# Patient Record
Sex: Female | Born: 1975 | Race: White | Hispanic: No | Marital: Single | Smoking: Never smoker
Health system: Southern US, Community
[De-identification: ages and names within clinical notes are randomized; demographics above are authoritative.]

## PROBLEM LIST (undated history)

## (undated) HISTORY — PX: OTHER SURGICAL HISTORY: SHX169

---

## 2020-02-23 ENCOUNTER — Ambulatory Visit: Payer: Self-pay | Attending: Family

## 2020-02-23 DIAGNOSIS — Z23 Encounter for immunization: Secondary | ICD-10-CM

## 2020-02-23 NOTE — Progress Notes (Signed)
   Covid-19 Vaccination Clinic  Name:  Kristine James    MRN: [id number] DOB: March 25, 1976  02/23/2020  [name] was observed post Covid-19 immunization for 15 minutes without incident. She was provided with Vaccine Information Sheet and instruction to access the V-Safe system.   [name] was instructed to call 911 with any severe reactions post vaccine: Marland Kitchen Difficulty breathing  . Swelling of face and throat  . A fast heartbeat  . A bad rash all over body  . Dizziness and weakness   Immunizations Administered    Name Date Dose VIS Date Route   Pfizer COVID-19 Vaccine 02/23/2020 12:59 PM 0.3 mL 09/30/2018 Intramuscular   Manufacturer: [employer], [employer]   Lot: [id number]   NDC: [address]-9

## 2020-03-15 ENCOUNTER — Ambulatory Visit: Payer: Self-pay | Attending: Internal Medicine

## 2020-03-15 DIAGNOSIS — Z23 Encounter for immunization: Secondary | ICD-10-CM

## 2020-03-15 NOTE — Progress Notes (Signed)
° °  Covid-19 Vaccination Clinic  Name:  Annya Lizana    MRN: [id number] DOB: 11/05/1975  03/15/2020  Ms. Blevins was observed post Covid-19 immunization for 15 minutes without incident. She was provided with Vaccine Information Sheet and instruction to access the V-Safe system.   Ms. Giuliano was instructed to call 911 with any severe reactions post vaccine:  Difficulty breathing   Swelling of face and throat   A fast heartbeat   A bad rash all over body   Dizziness and weakness   Immunizations Administered    Name Date Dose VIS Date Route   Pfizer COVID-19 Vaccine 03/15/2020 10:16 AM 0.3 mL 09/30/2018 Intramuscular   Manufacturer: [employer], [employer]   Lot: [id number]   NDC: [address]-3

## 2020-08-09 ENCOUNTER — Other Ambulatory Visit: Payer: Self-pay | Admitting: Obstetrics

## 2020-08-09 ENCOUNTER — Ambulatory Visit
Admission: RE | Admit: 2020-08-09 | Discharge: 2020-08-09 | Disposition: A | Discharge status: Home / Self Care | Payer: BC Managed Care – PPO | Source: Ambulatory Visit | Attending: Obstetrics | Admitting: Obstetrics

## 2020-08-09 DIAGNOSIS — E282 Polycystic ovarian syndrome: Secondary | ICD-10-CM

## 2020-08-09 DIAGNOSIS — R7989 Other specified abnormal findings of blood chemistry: Secondary | ICD-10-CM

## 2020-08-09 DIAGNOSIS — N6310 Unspecified lump in the right breast, unspecified quadrant: Secondary | ICD-10-CM

## 2020-09-02 ENCOUNTER — Ambulatory Visit
Admission: RE | Admit: 2020-09-02 | Discharge: 2020-09-02 | Disposition: A | Discharge status: Home / Self Care | Payer: BC Managed Care – PPO | Source: Ambulatory Visit | Attending: Obstetrics | Admitting: Obstetrics

## 2020-09-02 ENCOUNTER — Other Ambulatory Visit: Payer: Self-pay

## 2020-09-02 DIAGNOSIS — N6310 Unspecified lump in the right breast, unspecified quadrant: Secondary | ICD-10-CM

## 2021-02-09 ENCOUNTER — Ambulatory Visit: Payer: BC Managed Care – PPO | Attending: Family

## 2021-02-09 DIAGNOSIS — Z23 Encounter for immunization: Secondary | ICD-10-CM

## 2021-02-09 NOTE — Progress Notes (Signed)
   Covid-19 Vaccination Clinic  Name:  Kristine James    MRN: [id number] DOB: July 16, 1976  02/09/2021  [name] was observed post Covid-19 immunization for 15 minutes without incident. She was provided with Vaccine Information Sheet and instruction to access the V-Safe system.   [name] was instructed to call 911 with any severe reactions post vaccine: Difficulty breathing  Swelling of face and throat  A fast heartbeat  A bad rash all over body  Dizziness and weakness   Immunizations Administered     Name Date Dose VIS Date Route   Pfizer COVID-19 Vaccine 02/09/2021  4:21 PM 0.3 mL 05/25/2020 Intramuscular   Manufacturer: [employer], [employer]   Lot: [id number]   NDC: [address]-7

## 2021-03-06 ENCOUNTER — Ambulatory Visit: Payer: BC Managed Care – PPO | Admitting: Family Medicine

## 2021-03-06 ENCOUNTER — Ambulatory Visit
Admission: RE | Admit: 2021-03-06 | Discharge: 2021-03-06 | Disposition: A | Discharge status: Home / Self Care | Payer: BC Managed Care – PPO | Source: Ambulatory Visit | Attending: Family Medicine | Admitting: Family Medicine

## 2021-03-06 ENCOUNTER — Other Ambulatory Visit: Payer: Self-pay

## 2021-03-06 ENCOUNTER — Encounter: Payer: Self-pay | Admitting: Family Medicine

## 2021-03-06 ENCOUNTER — Ambulatory Visit: Payer: Self-pay

## 2021-03-06 VITALS — BP 104/62 | Ht 61.0 in | Wt 122.0 lb

## 2021-03-06 DIAGNOSIS — M25511 Pain in right shoulder: Secondary | ICD-10-CM

## 2021-03-06 DIAGNOSIS — G8929 Other chronic pain: Secondary | ICD-10-CM

## 2021-03-06 NOTE — Progress Notes (Signed)
PCP: Kathreen Cosier, MD  Subjective:   HPI: Patient is a 45 y.o. female here for right shoulder pain.  Patient reports in January she dove forward away from a bus and landed on ground, sustaining injury to right shoulder. Immediate pain anterior right shoulder with some lateral pain. Has tried home exercises, pilates, massage, 9 visits of physical therapy up until about 3 weeks ago. Motion continues to be limited, difficulty sleeping and with overhead motions. Recalls years ago she had a problem with right shoulder and had 'weakness' here.  History reviewed. No pertinent past medical history.  No current outpatient medications on file prior to visit.   No current facility-administered medications on file prior to visit.    History reviewed. No pertinent surgical history.  No Known Allergies  Social History   Socioeconomic History   Marital status: Single    Spouse name: Not on file   Number of children: Not on file   Years of education: Not on file   Highest education level: Not on file  Occupational History   Not on file  Tobacco Use   Smoking status: Not on file   Smokeless tobacco: Not on file  Substance and Sexual Activity   Alcohol use: Not on file   Drug use: Not on file   Sexual activity: Not on file  Other Topics Concern   Not on file  Social History Narrative   Not on file   Social Determinants of Health   Financial Resource Strain: Not on file  Food Insecurity: Not on file  Transportation Needs: Not on file  Physical Activity: Not on file  Stress: Not on file  Social Connections: Not on file  Intimate Partner Violence: Not on file    History reviewed. No pertinent family history.  BP 104/62   Ht 5\' 1"  (1.549 m)   Wt 122 lb (55.3 kg)   BMI 23.05 kg/m   No flowsheet data found.  No flowsheet data found.  Review of Systems: See HPI above.     Objective:  Physical Exam:  Gen: NAD, comfortable in exam room  Right shoulder: No  swelling, ecchymoses.  No gross deformity. No TTP AC joint, biceps tendon. FROM with painful arc. Positive Hawkins, Neers. Negative Yergasons. Strength 4/5 with empty can and 5/5 resisted internal/external rotation.  Pain empty can and ER. Negative apprehension. NV intact distally.  Complete MSK u/s right shoulder: Biceps tendon: intact on long and short views without tenosynovitis Pec major tendon: intact Subscapularis: intact without abnormalities AC joint: mild arthropathy without effusion Infraspinatus: intact Supraspinatus: focus of hyperechogenicity consistent with calcification interstitially; near biceps tendon there is cortical irregularity and disruption of supraspinatus consistent with at least high grade partial tear Posterior glenohumeral joint: normal without effusion, paralabral cyst, labral abnormalities.  Impression: at least partial thickness supraspinatus tear, calcific tendinopathy of supraspinatus.   Assessment & Plan:  1. Right shoulder pain - concern for at least high grade partial tear of supraspinatus near biceps tendon.  Will go ahead with MRI to further assess.  X-rays ordered as precursor and will call patient with results.  Has done extensive physical therapy (9 visits), home exercises, OTC medications without relief.

## 2021-03-06 NOTE — Patient Instructions (Signed)
Your ultrasound shows a likely high grade partial tear of one of your rotator cuff muscles. We will go ahead with an MRI to assess this in more detail. Get x-rays after you leave today (required by insurance). Ok to continue with your home exercises, massage, medications as we discussed in the meantime. Either follow up with me after the MRI for a no charge visit or I can call you with the results and the next steps, whichever you prefer.

## 2021-03-07 ENCOUNTER — Ambulatory Visit
Admission: RE | Admit: 2021-03-07 | Discharge: 2021-03-07 | Disposition: A | Discharge status: Home / Self Care | Payer: BC Managed Care – PPO | Source: Ambulatory Visit | Attending: Family Medicine | Admitting: Family Medicine

## 2021-03-07 DIAGNOSIS — G8929 Other chronic pain: Secondary | ICD-10-CM

## 2021-03-09 ENCOUNTER — Ambulatory Visit (INDEPENDENT_AMBULATORY_CARE_PROVIDER_SITE_OTHER): Payer: BC Managed Care – PPO | Admitting: Family Medicine

## 2021-03-09 ENCOUNTER — Other Ambulatory Visit: Payer: Self-pay

## 2021-03-09 ENCOUNTER — Encounter: Payer: Self-pay | Admitting: Family Medicine

## 2021-03-09 VITALS — BP 120/76 | Wt 122.0 lb

## 2021-03-09 DIAGNOSIS — G8929 Other chronic pain: Secondary | ICD-10-CM

## 2021-03-09 DIAGNOSIS — M25511 Pain in right shoulder: Secondary | ICD-10-CM

## 2021-03-09 NOTE — Progress Notes (Signed)
MRI reviewed and discussed with patient. Unfortunately MRI confirmed full thickness supraspinatus tear but no evidence muscle atrophy and with < 1 cm retraction.  Has been symptomatic for 7 months and not improving with conservative measures.  Will refer to ortho to discuss repair.

## 2021-03-09 NOTE — Patient Instructions (Signed)
We are referring you to an orthopedic surgeon for your right shoulder rotator cuff tear.  Dr. [facility] [address] [place], [place] [phone]  Appt: 03/14/21 @ 8:15 am Please arrive at 8 am.  If you need to reschedule/change this appt please call the number above.

## 2021-06-28 ENCOUNTER — Ambulatory Visit (INDEPENDENT_AMBULATORY_CARE_PROVIDER_SITE_OTHER): Payer: BC Managed Care – PPO | Admitting: Sports Medicine

## 2021-06-28 VITALS — BP 110/82 | Ht 61.0 in | Wt 122.0 lb

## 2021-06-28 DIAGNOSIS — M25511 Pain in right shoulder: Secondary | ICD-10-CM | POA: Diagnosis not present

## 2021-06-28 DIAGNOSIS — G8929 Other chronic pain: Secondary | ICD-10-CM | POA: Diagnosis not present

## 2021-06-28 DIAGNOSIS — M25512 Pain in left shoulder: Secondary | ICD-10-CM | POA: Diagnosis not present

## 2021-06-28 MED ORDER — IBUPROFEN 600 MG PO TABS: 600.0000 mg | ORAL_TABLET | Freq: Four times a day (QID) | ORAL | 0 refills | Status: AC | PRN

## 2021-06-28 NOTE — Progress Notes (Signed)
PCP: Kathreen Cosier, MD  Subjective:   HPI: Patient is a 45 y.o. female here for evaluation of left shoulder pain, chronic right shoulder pain.  Chronic right shoulder pain/stiffness -patient had a history of a full-thickness right supraspinatus tear back earlier in 2022, she underwent rotator cuff repair by Dr. Everardo Pacific on March 20, 2021.  She has been undergoing physical therapy, recently twice a week at [facility] and feels like this has helped with motion and strength, although it is not 100%.  She previously also saw Dr. Ave Filter for a second opinion months ago, and he reiterated the same thing with importance of PT and prolonged recovery.  Patient wants to be sure that she is undergoing the appropriate treatment course to get this better today.  Left shoulder -she states that about 3 weeks ago she was pushing herself to get out of the pool when she felt a pain in the anterior left shoulder.  She denies feeling any pop, no swelling, erythema or ecchymosis.  She did take ibuprofen for about the first week with no significant improvement.  She will have pain when reaching outward at times. She denies prior injury of this left shoulder.  No nighttime pain.   No past medical history on file.  No current outpatient medications on file prior to visit.   No current facility-administered medications on file prior to visit.    No past surgical history on file.  No Known Allergies  BP 110/82   Ht 5\' 1"  (1.549 m)   Wt 122 lb (55.3 kg)   BMI 23.05 kg/m   [facility] 06/28/2021  Frequency of aerobic exercise (# of days/week) 1  Average time in minutes 60  Frequency of strengthening activities (# of days/week) 7    No flowsheet data found.      Objective:  Physical Exam:  Gen: Well-appearing, in no acute distress; non-toxic CV: Regular Rate. Well-perfused. Warm.  Resp: Breathing unlabored on room air; no wheezing. Psych: Fluid speech in  conversation; appropriate affect; normal thought process Neuro: Sensation intact throughout. No gross coordination deficits.  MSK:   - Right shoulder: Prior portal incision sites well-healed.  No erythema, ecchymosis or swelling.  There is limited external rotation and abduction of the shoulder.  Internal rotation and flexion equivocal to contralateral arm.  5/5 strength.  Neurovascular intact distally.  - Left shoulder: There is mild TTP noted around the bicipital groove just medial to this.  There is no overlying erythema ecchymosis or swelling.  No evidence of bony deformity.  Range of motion is full in all directions.  5/5 strength throughout.  + Mildly positive speeds test, negative Yergason's. + Equivocal Hornblower's test.  Negative empty can, Jobes, internal/external rotation.  Negative Hawkins impingement.   Assessment & Plan:  1. Chronic right shoulder pain s/p supraspinatus repair Aug 2021 by Dr. Sep 2021.  Patient with no worrisome findings on exam today.  She does have some restriction with external rotation and AB duction, although we did discuss she likely had a rotator cuff tear for many months, and it takes up to 6-12 months for full recovery and improvement with PT to regain range of motion and full strength.  2. Left shoulder pain, subacute -etiology unclear, exam more so indicative of biceps versus pectoralis minor pathology.  We did provide the patient with rotator cuff and tendon home exercises to do.  She may ice or heat over painful area and take over-the-counter anti-inflammatories as needed.  She will return in about 2 weeks if her shoulder pain is still not improving and at that time we will do complete ultrasound of the left shoulder.  She is agreeable to this plan.  Madelyn Brunner, DO PGY-4, Sports Medicine Fellow [facility]  Addendum:  I was the preceptor for this visit and available for immediate consultation.  Norton Blizzard MD Marrianne Mood

## 2021-06-28 NOTE — Patient Instructions (Signed)
It was great to meet you today, thank you for letting me participate in your care!  Today, we discussed your shoulders. Your right shoulder continues to come along.  Be sure you are continuing your physical therapy and exercises to increase range of motion.  For your left shoulder, We will give you a set of home exercises to perform once daily. You may ice or heat the shoulder where it is painful. You may take over-the-counter Aleve/ibuprofen or Tylenol for any severe pain.  You will follow-up in about 2.5-3 weeks if your shoulder is not at least 80% better.  If you have any further questions, please give the clinic a call [phone].  Cheers,  Madelyn Brunner, DO [facility]

## 2021-07-12 ENCOUNTER — Encounter: Payer: Self-pay | Admitting: Family Medicine

## 2021-07-12 ENCOUNTER — Ambulatory Visit (INDEPENDENT_AMBULATORY_CARE_PROVIDER_SITE_OTHER): Payer: BC Managed Care – PPO | Admitting: Family Medicine

## 2021-07-12 ENCOUNTER — Ambulatory Visit: Payer: Self-pay

## 2021-07-12 VITALS — BP 102/70 | Ht 61.0 in | Wt 122.0 lb

## 2021-07-12 DIAGNOSIS — M25512 Pain in left shoulder: Secondary | ICD-10-CM | POA: Diagnosis not present

## 2021-07-12 NOTE — Patient Instructions (Signed)
You strained your biceps tendon and have a very small interstitial tear of your supraspinatus (one of the rotator cuff muscles). This isn't like what happened to the right shoulder and should do very well with conservative treatment. Continue with physical therapy - we will let [facility] know what these diagnoses are Aleve 2 tabs twice a day with food for pain and inflammation as needed. Icing 15 minutes at a time as needed. Do home exercises on days you don't go to therapy. For swimming and working out at [employer], do activities that don't make the pain worse than a 3 on a scale of 1-10. Follow up with [place] in 5-6 weeks for reevaluation.

## 2021-07-12 NOTE — Progress Notes (Signed)
PCP: Kathreen Cosier, MD  Subjective:   HPI: Patient is a 45 y.o. female here for left shoulder pain.  11/23: Chronic right shoulder pain/stiffness -patient had a history of a full-thickness right supraspinatus tear back earlier in 2022, she underwent rotator cuff repair by Dr. Everardo Pacific on March 20, 2021.  She has been undergoing physical therapy, recently twice a week at [facility] and feels like this has helped with motion and strength, although it is not 100%.  She previously also saw Dr. Ave Filter for a second opinion months ago, and he reiterated the same thing with importance of PT and prolonged recovery.  Patient wants to be sure that she is undergoing the appropriate treatment course to get this better today.  Left shoulder -she states that about 3 weeks ago she was pushing herself to get out of the pool when she felt a pain in the anterior left shoulder.  She denies feeling any pop, no swelling, erythema or ecchymosis.  She did take ibuprofen for about the first week with no significant improvement.  She will have pain when reaching outward at times. She denies prior injury of this left shoulder.  No nighttime pain.  12/7: Patient returns with continued left shoulder pain for ultrasound. She has been doing physical therapy, home exercises without much improvement in anterior left shoulder pain.  History reviewed. No pertinent past medical history.  Current Outpatient Medications on File Prior to Visit  Medication Sig Dispense Refill   ibuprofen (ADVIL) 600 MG tablet Take 1 tablet (600 mg total) by mouth every 6 (six) hours as needed. 30 tablet 0   No current facility-administered medications on file prior to visit.    History reviewed. No pertinent surgical history.  No Known Allergies  BP 102/70   Ht 5\' 1"  (1.549 m)   Wt 122 lb (55.3 kg)   BMI 23.05 kg/m   [facility] Adult Exercise 06/28/2021 07/12/2021  Frequency of aerobic exercise (# of days/week) 1 2   Average time in minutes 60 10  Frequency of strengthening activities (# of days/week) 7 3    No flowsheet data found.      Objective:  Physical Exam:  Gen: NAD, comfortable in exam room  Left shoulder: No swelling, ecchymoses.  No gross deformity. No TTP AC joint.  Tenderness over biceps tendon. FROM. Negative Hawkins, Neers. Negative Yergasons. Strength 5/5 with empty can and resisted internal/external rotation. NV intact distally.  Complete MSK u/s left shoulder: Biceps tendon: intact without tenosynovitis Pec major tendon: intact Subscapularis: intact without abnormalities AC joint: mild arthropathy Infraspinatus: intact with some insertional calcific tendinopathy Supraspinatus: pain with compression.  Very small interstitial tear with calcification Posterior glenohumeral joint: no effusion.  Posterior labrum normal.  Impression: Small interstitial tear of supraspinatus with calcification.  Insertional calcific tendinopathy of infraspinatus.   Assessment & Plan:  1. Left shoulder pain - consistent with proximal biceps tendon strain and small interstitial tear of supraspinatus.  Continue physical therapy.  Aleve, icing.  F/u in 5-6 weeks.  Consider nitro patches if not improving.

## 2021-08-03 ENCOUNTER — Other Ambulatory Visit: Payer: Self-pay | Admitting: Obstetrics

## 2021-08-03 DIAGNOSIS — Z1231 Encounter for screening mammogram for malignant neoplasm of breast: Secondary | ICD-10-CM

## 2021-08-14 ENCOUNTER — Ambulatory Visit: Payer: BC Managed Care – PPO | Admitting: Family Medicine

## 2021-08-14 ENCOUNTER — Encounter: Payer: Self-pay | Admitting: Family Medicine

## 2021-08-14 VITALS — BP 110/68 | Ht 61.0 in | Wt 122.0 lb

## 2021-08-14 DIAGNOSIS — M25512 Pain in left shoulder: Secondary | ICD-10-CM | POA: Diagnosis not present

## 2021-08-14 DIAGNOSIS — M79672 Pain in left foot: Secondary | ICD-10-CM | POA: Diagnosis not present

## 2021-08-14 NOTE — Patient Instructions (Signed)
Keep working hard on the shoulder rehab - I know it's very frustrating!  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 Do home exercises and stretches as directed for at least a couple weeks. Ice bucket 10-15 minutes at end of day if needed - can ice 3-4 times a day. Avoid uneven ground, hills as much as possible. Heel lifts in shoes or shoes with a natural heel lift can be helpful. Consider physical therapy, orthotics, nitro patches if not improving as expected. Follow up in 6 weeks or as needed if you're improving.

## 2021-08-14 NOTE — Progress Notes (Signed)
PCP: Kathreen Cosier, MD  Subjective:   HPI: Patient is a 46 y.o. female here for follow up for prior left shoulder injury and s/p right shoulder rotator cuff repair. Reports that she is doing better, able to do all of her daily activities without issues but still does not feel like she is back to her normal self. Endorses minimal pain but feeling better except when she engages in some weight bearing activities. Today she also presents with a new concern of pain posterior left heel that has been ongoing for the past 2-3 weeks although is better this morning. 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 Pain developed when she was exercising, endorses pain along left heel and up back of her foot but denies other radiating pain. Denies numbness, tingling, rash or recent injury or trauma. States that she was able to exercise yesterday which helped the pain improve significantly from its initial severity to now. Denies current pain but states that it feels sensitive when she puts on her shoes or places weight on it. When the pain initially started, she changed shoes which she also shares has helped the pain improve.    No past medical history on file.  Current Outpatient Medications on File Prior to Visit  Medication Sig Dispense Refill   ibuprofen (ADVIL) 600 MG tablet Take 1 tablet (600 mg total) by mouth every 6 (six) hours as needed. 30 tablet 0   No current facility-administered medications on file prior to visit.    No past surgical history on file.  No Known Allergies  BP 110/68    Ht 5\' 1"  (1.549 m)    Wt 122 lb (55.3 kg)    BMI 23.05 kg/m   [facility] 06/28/2021 07/12/2021 08/14/2021  Frequency of aerobic exercise (# of days/week) 1 2 2   Average time in minutes 60 10 10  Frequency of strengthening activities (# of days/week) 7 3 3     No flowsheet data  found.      Objective:  Physical Exam:  Gen: NAD, comfortable in exam room Resp: breathing comfortably without signs of respiratory distress. MSK: no obvious edema, ecchymosis or rash noted along both right and left shoulder joints, minimal tenderness noted along right biceps, 5/5 UE strength bilaterally.  IR on right only to L3 level, full on left to about T8, negative Yergasons, Neers and Hawkins testing bilaterally,    Minimal point tenderness along deep palpation of left heel and achilles tendon, no edema, erythema or ecchymosis noted, normal active and passive ROM along ankle joints bilaterally, distal pulses strong and equal bilaterally.  Negative calcaneal squeeze. Neuro: normal gait without limitation with ambulation    Assessment & Plan:  1. Achilles tendonitis: conservative measures discussed, encouraged comfortable shoes with heel lifts and stretching 2. Acute shoulder pain: encouraged to continue physical therapy

## 2021-09-08 ENCOUNTER — Other Ambulatory Visit: Payer: Self-pay

## 2021-09-08 ENCOUNTER — Ambulatory Visit
Admission: RE | Admit: 2021-09-08 | Discharge: 2021-09-08 | Disposition: A | Discharge status: Home / Self Care | Payer: BC Managed Care – PPO | Source: Ambulatory Visit | Attending: Obstetrics | Admitting: Obstetrics

## 2021-09-08 ENCOUNTER — Ambulatory Visit: Payer: BC Managed Care – PPO

## 2021-09-08 DIAGNOSIS — Z1231 Encounter for screening mammogram for malignant neoplasm of breast: Secondary | ICD-10-CM

## 2022-07-30 IMAGING — MG MM DIGITAL SCREENING BILAT W/ TOMO AND CAD
8 series · 9 of 24 positions shown · non-contrast
Comparison: Previous exam(s).

CLINICAL DATA: Screening.

EXAM:
DIGITAL SCREENING BILATERAL MAMMOGRAM WITH TOMOSYNTHESIS AND CAD
TECHNIQUE: Bilateral screening digital craniocaudal and mediolateral oblique
mammograms were obtained. Bilateral screening digital breast
tomosynthesis was performed. The images were evaluated with
computer-aided detection.

[R CC synth-2D]
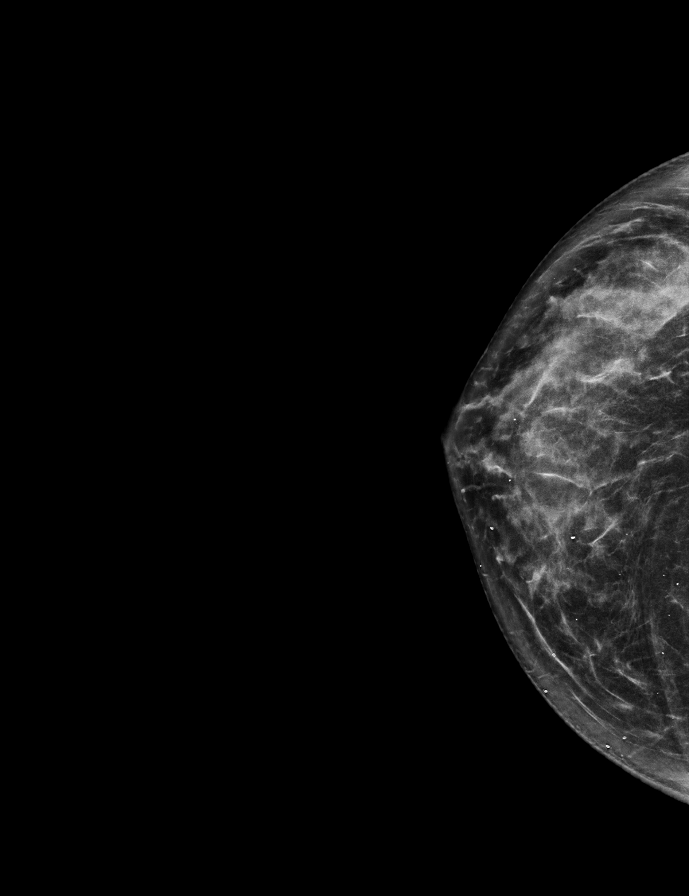

[R MLO synth-2D]
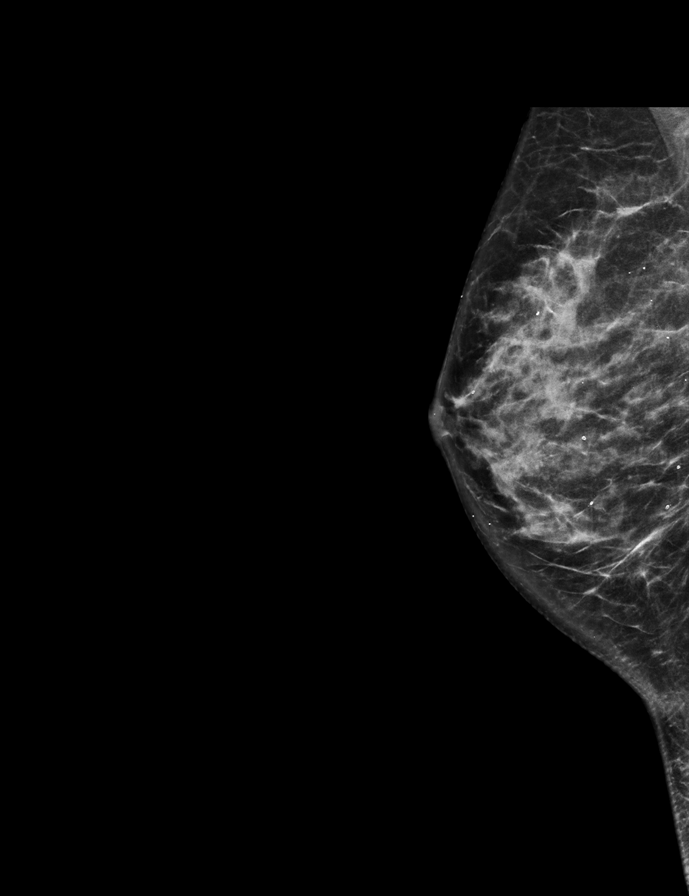

[L MLO synth-2D]
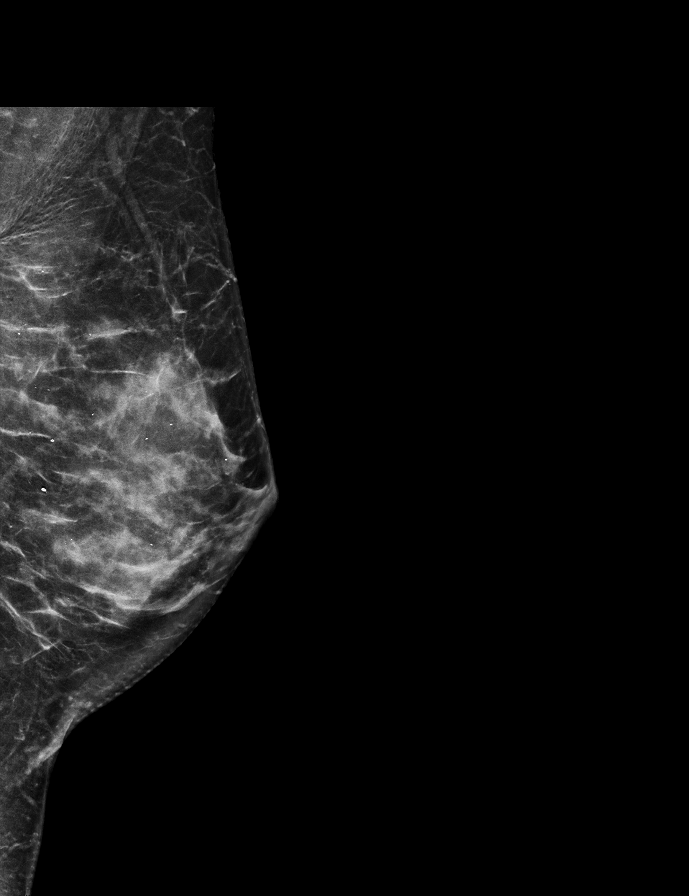

[L CC synth-2D]
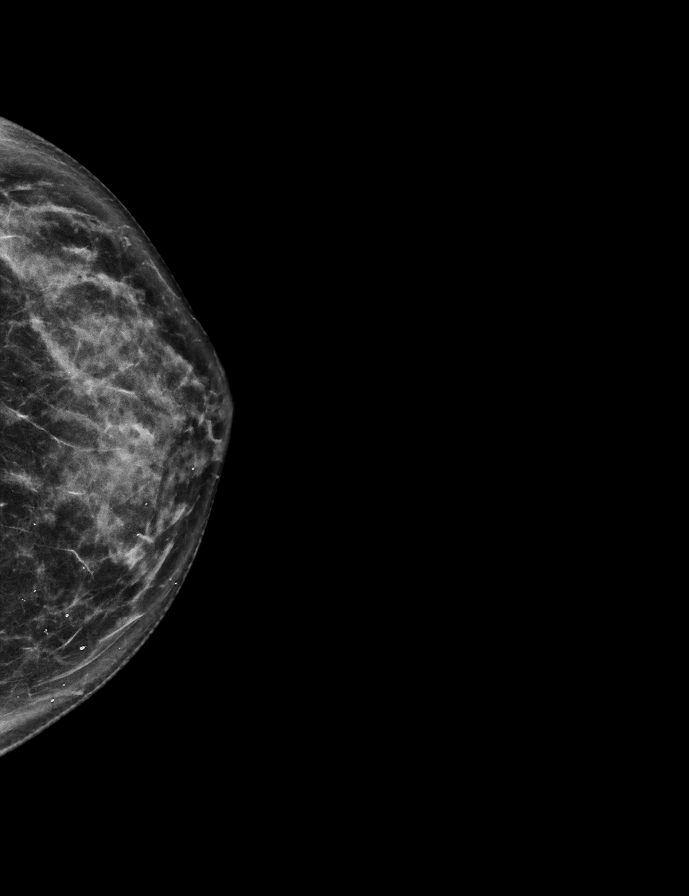

[L CC tomo · 2 of 62 frames shown]
[frame 21/62]
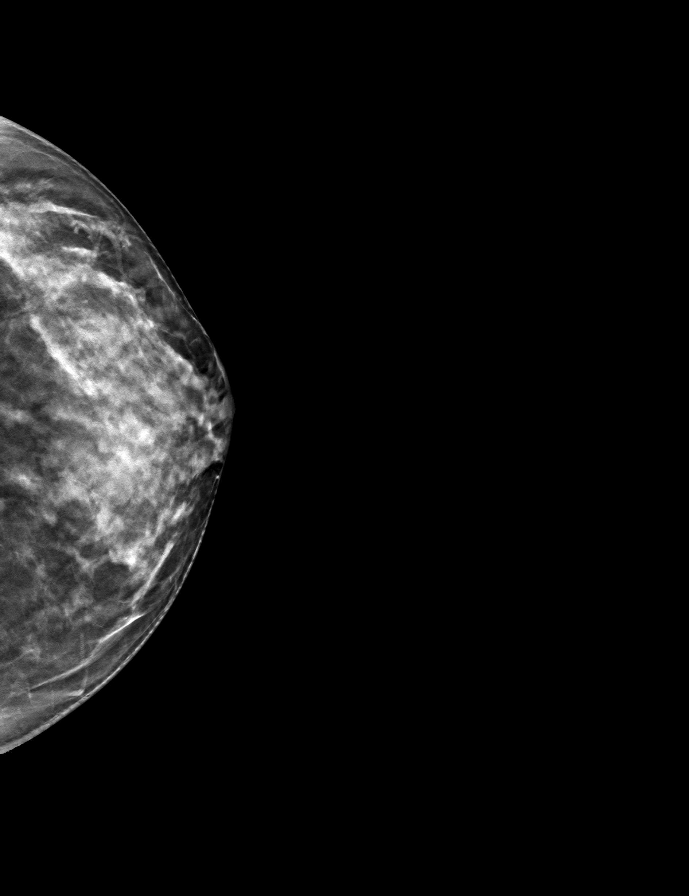
[frame 31/62]
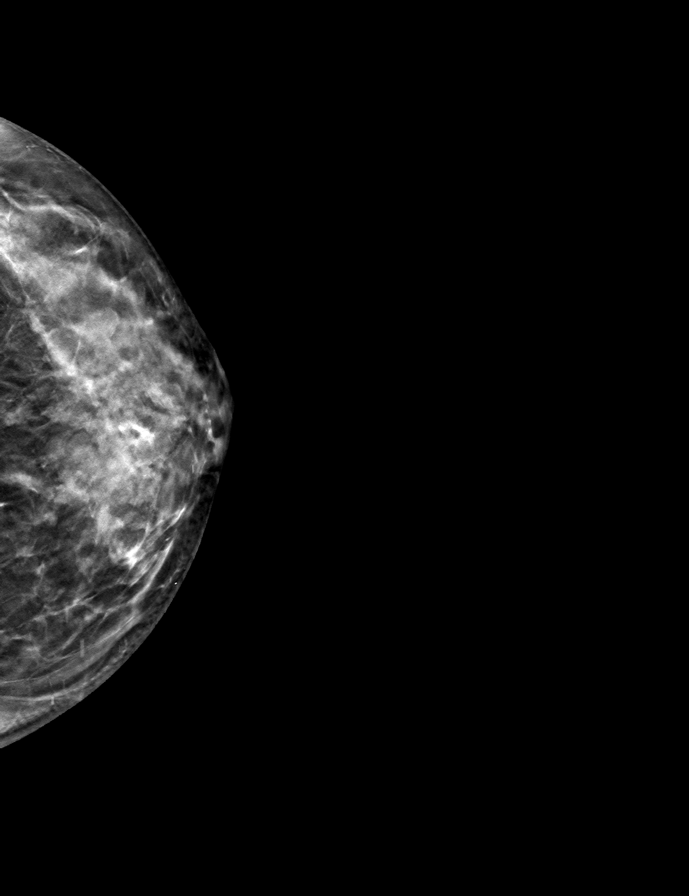

[L MLO tomo · tomo slice 33/64.0]
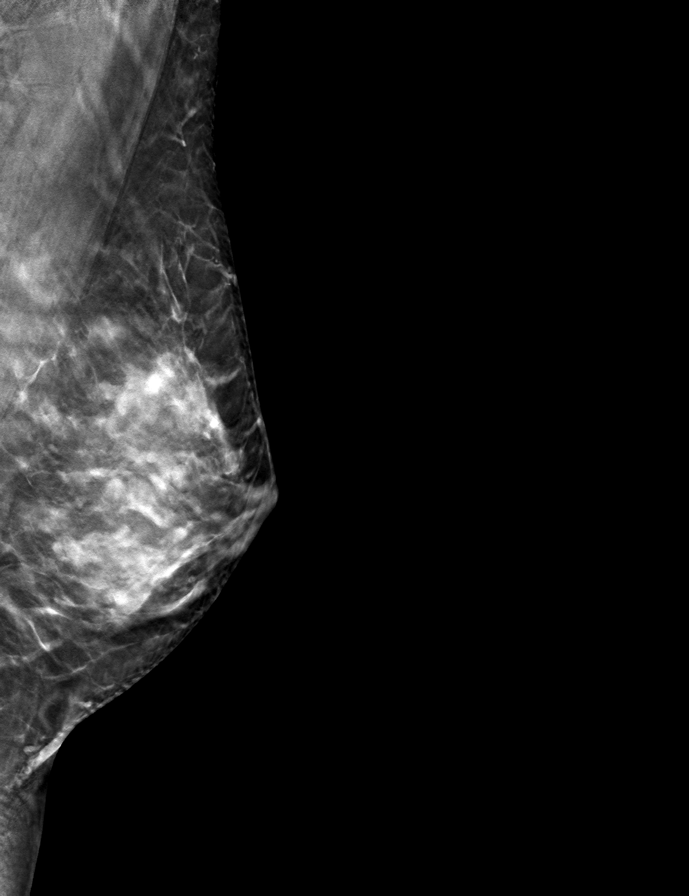

[R CC tomo · tomo slice 32/63.0]
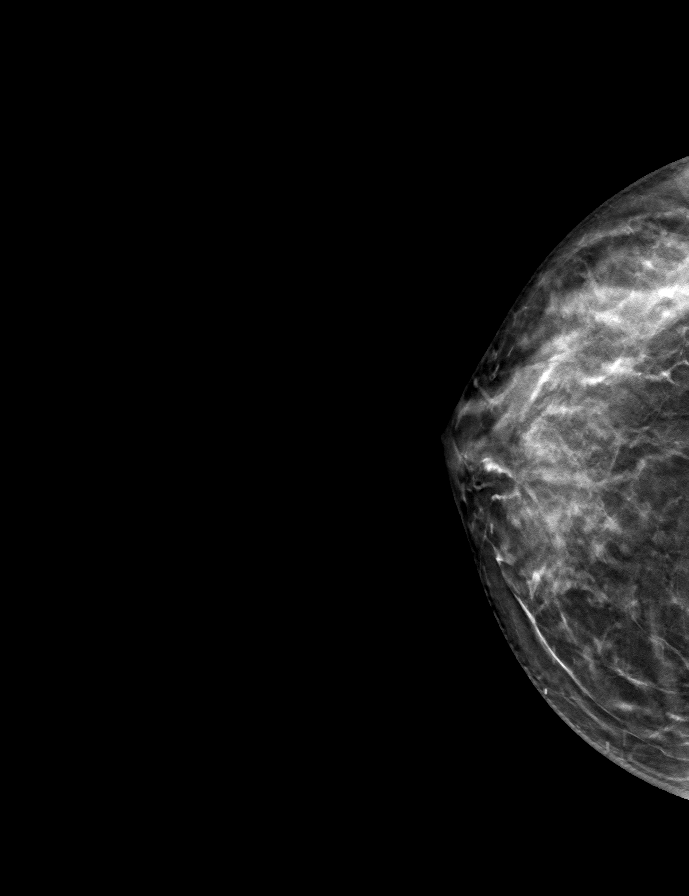

[R MLO tomo · tomo slice 32/63.0]
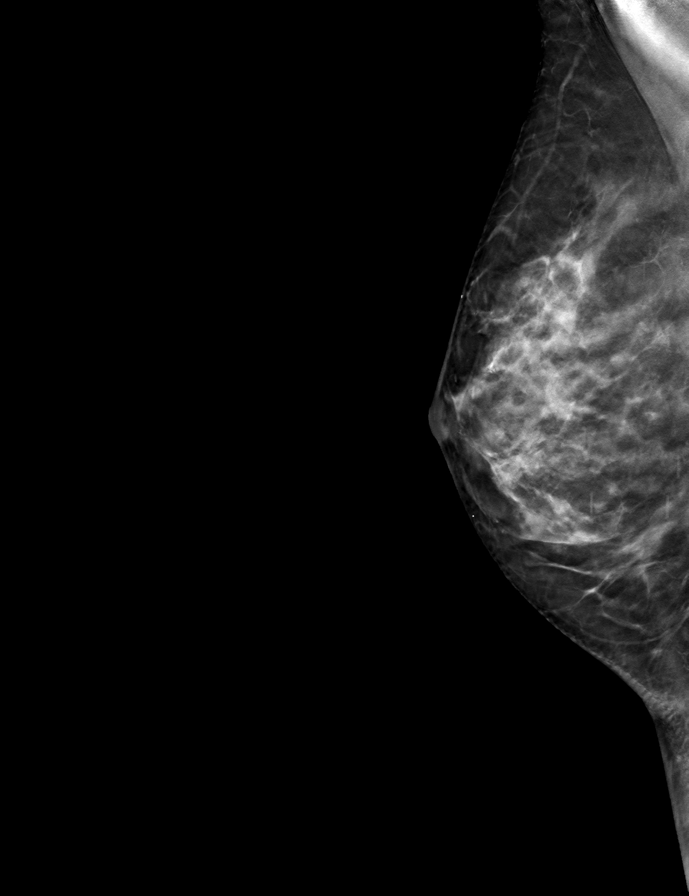

[9 of 24 positions shown; findings below may reference images not displayed]

ACR Breast Density Category c: The breast tissue is heterogeneously
dense, which may obscure small masses.
FINDINGS: There are no findings suspicious for malignancy.
IMPRESSION: No mammographic evidence of malignancy. A result letter of this
screening mammogram will be mailed directly to the patient.

RECOMMENDATION:
Screening mammogram in one year. (Code:Q3-W-BC3)

BI-RADS CATEGORY  1: Negative.

## 2022-08-10 ENCOUNTER — Other Ambulatory Visit: Payer: Self-pay | Admitting: Obstetrics

## 2022-08-10 DIAGNOSIS — Z1231 Encounter for screening mammogram for malignant neoplasm of breast: Secondary | ICD-10-CM
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
## 2022-10-01 ENCOUNTER — Other Ambulatory Visit: Payer: Self-pay

## 2022-10-01 ENCOUNTER — Encounter: Payer: Self-pay | Admitting: Physical Therapy

## 2022-10-01 ENCOUNTER — Ambulatory Visit: Payer: BC Managed Care – PPO | Attending: Obstetrics and Gynecology | Admitting: Physical Therapy

## 2022-10-01 DIAGNOSIS — R102 Pelvic and perineal pain: Secondary | ICD-10-CM | POA: Diagnosis present

## 2022-10-01 DIAGNOSIS — R252 Cramp and spasm: Secondary | ICD-10-CM | POA: Diagnosis present

## 2022-10-01 NOTE — Patient Instructions (Signed)
Moisturizers They are used in the vagina to hydrate the mucous membrane that make up the vaginal canal. Designed to keep a more normal acid balance (ph) Once placed in the vagina, it will last between two to three days.  Use 2-3 times per week at bedtime  Ingredients to avoid is glycerin and fragrance, can increase chance of infection Should not be used just before sex due to causing irritation Most are gels administered either in a tampon-shaped applicator or as a vaginal suppository. They are non-hormonal.   Types of Moisturizers(internal use)  Vitamin E vaginal suppositories- Whole foods, Amazon Moist Again Coconut oil- can break down condoms Julva- (Do no use if on Tamoxifen) amazon Yes moisturizer- amazon NeuEve Silk , NeuEve Silver for menopausal or over 65 (if have severe vaginal atrophy or cancer treatments use NeuEve Silk for  1 month than move to [employer])- [employer], Toad Hop.com Olive and Bee intimate cream- www.oliveandbee.com.au Mae vaginal moisturizer- Amazon Aloe Good Clean Love Hyaluronic acid Hyalofemme replens   Creams to use externally on the Vulva area [employer]'s (good for for cancer patients that had radiation to the area)- [place] or [employer].FlyingBasics.com.br V-magic cream - amazon Julva-amazon Vital "V Wild Yam salve ( help moisturize and help with thinning vulvar area, does have Grayling by Irwin Brakeman labial moisturizer (Amazon,  Coconut or olive oil aloe Good Clean Love Enchanted Rose by intimate rose  Things to avoid in the vaginal area Do not use things to irritate the vulvar area No lotions just specialized creams for the vulva area- Neogyn, V-magic, No soaps; can use Aveeno or Calendula cleanser if needed. Must be gentle No deodorants No douches Good to sleep without underwear to let the vaginal area to air out No scrubbing: spread the lips to let warm water rinse  over labias and pat dry  [facility] [address] [place],  [address] Phone # [phone] Fax [phone]

## 2022-10-02 ENCOUNTER — Ambulatory Visit
Admission: RE | Admit: 2022-10-02 | Discharge: 2022-10-02 | Disposition: A | Discharge status: Home / Self Care | Payer: BC Managed Care – PPO | Source: Ambulatory Visit | Attending: Obstetrics | Admitting: Obstetrics

## 2022-10-02 DIAGNOSIS — Z1231 Encounter for screening mammogram for malignant neoplasm of breast: Secondary | ICD-10-CM

## 2022-10-03 ENCOUNTER — Encounter: Payer: Self-pay | Admitting: Physical Therapy

## 2022-10-22 ENCOUNTER — Encounter: Payer: Self-pay | Admitting: Physical Therapy

## 2022-10-22 ENCOUNTER — Ambulatory Visit: Payer: BC Managed Care – PPO | Attending: Obstetrics and Gynecology | Admitting: Physical Therapy

## 2022-10-22 DIAGNOSIS — R102 Pelvic and perineal pain: Secondary | ICD-10-CM | POA: Diagnosis present

## 2022-10-22 DIAGNOSIS — R252 Cramp and spasm: Secondary | ICD-10-CM | POA: Diagnosis present

## 2022-10-22 NOTE — Therapy (Signed)
OUTPATIENT PHYSICAL THERAPY TREATMENT NOTE   Patient Name: Kristine James MRN: [id number] DOB:03/13/1976, 47 y.o., female Today's Date: 10/22/2022  PCP: Frederico Hamman, MD  REFERRING PROVIDER: Tona Sensing, FNP   END OF SESSION:   PT End of Session - 10/22/22 0818     Visit Number 2    Date for PT Re-Evaluation 12/24/22    Authorization Type BCBS    PT Start Time 0815   came late   PT Stop Time 0840    PT Time Calculation (min) 25 min    Activity Tolerance Patient tolerated treatment well    Behavior During Therapy [facility] for tasks assessed/performed             History reviewed. No pertinent past medical history. Past Surgical History:  Procedure Laterality Date   rotator cuff surgery Right    There are no problems to display for this patient.  REFERRING DIAG: R10.2 (ICD-10-CM) - Pelvic and perineal pain    THERAPY DIAG:  Cramp and spasm   Pelvic pain   Rationale for Evaluation and Treatment: Rehabilitation   ONSET DATE: 2020   SUBJECTIVE:                                                                                                                                                                                            SUBJECTIVE STATEMENT: I have not used the vaginal moisturizers. I am not sure about which one of the vaginal wands to use.  Fluid intake: Yes: drink water     PAIN:  Are you having pain? Yes NPRS scale: 1-10/10 Pain location:  in inner thighs, pelvic floor   Pain type: fire, cramp, tightness Pain description: intermittent    Aggravating factors: stress, stool being stuck at the sphincter Relieving factors: sleep, swimming pool, working out, body combat, zumba, body pump, sitting in warm water   PRECAUTIONS: None   WEIGHT BEARING RESTRICTIONS: No   FALLS:  Has patient fallen in last 6 months? No   LIVING ENVIRONMENT: Lives with: lives alone   OCCUPATION: Patient works in a lab, sitting   PLOF: Independent    PATIENT GOALS: reduce pain   PERTINENT HISTORY:  IBS, Rectocele Sexual abuse: No   BOWEL MOVEMENT: Pain with bowel movement: Yes Type of bowel movement:Type (Bristol Stool Scale) Type 4 and 5, Frequency daily, Strain No, and Splinting yes Fully empty rectum: No, residual stool in rectum Leakage: No Pads: No Fiber supplement: Yes: natural diet   URINATION: Pain with urination: No Fully empty bladder: No, when have rectal pain Stream: Strong Urgency: No Frequency: average Leakage: Laughing  PROLAPSE: Rectocele       OBJECTIVE:    COGNITION: Overall cognitive status: Within functional limits for tasks assessed                          SENSATION: Light touch: Appears intact Proprioception: Appears intact     POSTURE: No Significant postural limitations   PELVIC ALIGNMENT: correct alignment   LUMBARAROM/PROM:   A/PROM A/PROM  eval  Extension Decreased by 25%  Right lateral flexion Decreased by 25%    LOWER EXTREMITY ROM: Bilateral hip ROM is full     LOWER EXTREMITY MMT:   MMT Right eval Left eval  Hip extension 4/5 4/5  Hip abduction 4/5 4/5  Hip adduction 4/5 4/5    PALPATION:   General  Tenderness located on right lower abdominal, bilateral hip adductors                 External Perineal Exam tenderness located on the perineal body                             Internal Pelvic Floor tenderness located on bilateral levator ani and obturator internist   Patient confirms identification and approves PT to assess internal pelvic floor and treatment Yes   PELVIC MMT:   MMT eval  Internal Anal Sphincter 3/5  External Anal Sphincter 3/5  Puborectalis 3/5  (Blank rows = not tested)         TONE: increased   PROLAPSE: rectocele   TODAY'S TREATMENT:  10/22/22 Manual: Soft tissue mobilization: Educated patient on which vaginal massage wand would be good to use for internal work Educated  Internal pelvic floor techniques: No  emotional/communication barriers or cognitive limitation. Patient is motivated to learn. Patient understands and agrees with treatment goals and plan. PT explains patient will be examined in standing, sitting, and lying down to see how their muscles and joints work. When they are ready, they will be asked to remove their underwear so PT can examine their perineum. The patient is also given the option of providing their own chaperone as one is not provided in our facility. The patient also has the right and is explained the right to defer or refuse any part of the evaluation or treatment including the internal exam. With the patient's consent, PT will use one gloved finger to gently assess the muscles of the pelvic floor, seeing how well it contracts and relaxes and if there is muscle symmetry. After, the patient will get dressed and PT and patient will discuss exam findings and plan of care. PT and patient discuss plan of care, schedule, attendance policy and HEP activities.  Manual work to the puborectalis, along the iliococcygeus and obturator internist, and coccygeus.  Exercises: Stretches/mobility: Diaphragmatic breathing to elongate the pelvic floor.  Strengthening: Recumbent bike level 3 for 5 min. While assessing patient       PATIENT EDUCATION:  Education details: educated patient on vaginal moisturizers, ways to use her device to massage the pelvic floor muscles.  Person educated: Patient Education method: Explanation, Demonstration, Tactile cues, Verbal cues, and Handouts Education comprehension: verbalized understanding, returned demonstration, verbal cues required, tactile cues required, and needs further education   HOME EXERCISE PROGRAM: See above.    ASSESSMENT:   CLINICAL IMPRESSION: Patient is a 47 y.o. female  who was seen today for physical therapy evaluation and treatment for pelvic and perineal pain.  Patient understands which wand to use for manual work to the pelvic  floor going through the rectum. Patient had pain in the rectal muscles while manual work was performed. Patient understands now why she is to  Patient would benefit from skilled therapy to improve pain management and reduction of trigger points in the pelvic floor muscles to reduce her pain.    OBJECTIVE IMPAIRMENTS: decreased coordination, decreased strength, increased muscle spasms, and pain.    ACTIVITY LIMITATIONS: toileting   PARTICIPATION LIMITATIONS: community activity and occupation   PERSONAL FACTORS: Time since onset of injury/illness/exacerbation and 1-2 comorbidities: IBS, Rectocele  are also affecting patient's functional outcome.    REHAB POTENTIAL: Good   CLINICAL DECISION MAKING: Stable/uncomplicated   EVALUATION COMPLEXITY: Low     GOALS: Goals reviewed with patient? Yes   SHORT TERM GOALS: Target date: 10/29/22   Patient educated on vaginal moisturizers and to moisturize the rectum.  Baseline: Goal status: INITIAL   2.  Patient reports her pelvic pain decreased </= 25% due to reduction of pelvic floor trigger points.  Baseline:  Goal status: INITIAL   3.  Patient understands ways to manage her stress and pelvic pain.  Baseline:  Goal status: INITIAL     LONG TERM GOALS: Target date: 12/24/22   Patient independent with advanced HEP for manual work on pelvic floor and ways to elongate the pelvic floor.  Baseline:  Goal status: INITIAL   2.  Patient reports her pelvic floor pain decreased </= 75% due to reduction of trigger points.  Baseline:  Goal status: INITIAL   3.  Patient is able to use the rectal wand to reduce her trigger points when she is in increased pain.  Baseline:  Goal status: INITIAL   4.  Patient is able to fully relax her pelvic floor with a bowel movement so the stool is not stuck at the entrance.  Baseline:  Goal status: INITIAL       PLAN:   PT FREQUENCY: 1x/week   PT DURATION: 12 weeks   PLANNED INTERVENTIONS: Therapeutic  exercises, Therapeutic activity, Neuromuscular re-education, Patient/Family education, Joint mobilization, Dry Needling, Electrical stimulation, Spinal mobilization, Cryotherapy, Moist heat, Taping, Ultrasound, Biofeedback, and Manual therapy   PLAN FOR NEXT SESSION: manual work to the pelvic floor, hip stretches, diaphragmatic breathing see how it is going with the  wand ordering and vaginal moisturizers.    Earlie Counts, PT 10/22/22 8:46 AM

## 2022-11-09 ENCOUNTER — Ambulatory Visit: Payer: BC Managed Care – PPO | Attending: Obstetrics and Gynecology | Admitting: Physical Therapy

## 2022-11-09 ENCOUNTER — Encounter: Payer: Self-pay | Admitting: Physical Therapy

## 2022-11-09 DIAGNOSIS — R102 Pelvic and perineal pain: Secondary | ICD-10-CM | POA: Diagnosis present

## 2022-11-09 DIAGNOSIS — R252 Cramp and spasm: Secondary | ICD-10-CM | POA: Diagnosis present

## 2022-11-09 NOTE — Therapy (Signed)
OUTPATIENT PHYSICAL THERAPY TREATMENT NOTE   Patient Name: Kristine James MRN: [id number] DOB:September 03, 1975, 47 y.o., female Today's Date: 11/09/2022  PCP: Kathreen Cosier, MD  REFERRING PROVIDER: Arby Barrette, FNP   END OF SESSION:   PT End of Session - 11/09/22 0802     Visit Number 3    Date for PT Re-Evaluation 12/24/22    Authorization Type BCBS    PT Start Time 0800    PT Stop Time 0840    PT Time Calculation (min) 40 min    Activity Tolerance Patient tolerated treatment well    Behavior During Therapy [facility] for tasks assessed/performed             History reviewed. No pertinent past medical history. Past Surgical History:  Procedure Laterality Date   rotator cuff surgery Right    There are no problems to display for this patient.  REFERRING DIAG: R10.2 (ICD-10-CM) - Pelvic and perineal pain    THERAPY DIAG:  Cramp and spasm   Pelvic pain   Rationale for Evaluation and Treatment: Rehabilitation   ONSET DATE: 2020   SUBJECTIVE:                                                                                                                                                                                            SUBJECTIVE STATEMENT: I did not have pain for 3 days.   Fluid intake: Yes: drink water     PAIN:  Are you having pain? Yes NPRS scale: 1-10/10 Pain location:  in inner thighs, pelvic floor   Pain type: fire, cramp, tightness Pain description: intermittent    Aggravating factors: stress, stool being stuck at the sphincter Relieving factors: sleep, swimming pool, working out, body combat, zumba, body pump, sitting in warm water   PRECAUTIONS: None   WEIGHT BEARING RESTRICTIONS: No   FALLS:  Has patient fallen in last 6 months? No   LIVING ENVIRONMENT: Lives with: lives alone   OCCUPATION: Patient works in a lab, sitting   PLOF: Independent   PATIENT GOALS: reduce pain   PERTINENT HISTORY:  IBS, Rectocele Sexual abuse: No    BOWEL MOVEMENT: Pain with bowel movement: Yes Type of bowel movement:Type (Bristol Stool Scale) Type 4 and 5, Frequency daily, Strain No, and Splinting yes Fully empty rectum: No, residual stool in rectum Leakage: No Pads: No Fiber supplement: Yes: natural diet   URINATION: Pain with urination: No Fully empty bladder: No, when have rectal pain Stream: Strong Urgency: No Frequency: average Leakage: Laughing     PROLAPSE: Rectocele       OBJECTIVE:    COGNITION:  Overall cognitive status: Within functional limits for tasks assessed                          SENSATION: Light touch: Appears intact Proprioception: Appears intact     POSTURE: No Significant postural limitations   PELVIC ALIGNMENT: correct alignment   LUMBARAROM/PROM:   A/PROM A/PROM  eval  Extension Decreased by 25%  Right lateral flexion Decreased by 25%    LOWER EXTREMITY ROM: Bilateral hip ROM is full     LOWER EXTREMITY MMT:   MMT Right eval Left eval  Hip extension 4/5 4/5  Hip abduction 4/5 4/5  Hip adduction 4/5 4/5    PALPATION:   General  Tenderness located on right lower abdominal, bilateral hip adductors                 External Perineal Exam tenderness located on the perineal body                             Internal Pelvic Floor tenderness located on bilateral levator ani and obturator internist   Patient confirms identification and approves PT to assess internal pelvic floor and treatment Yes   PELVIC MMT:   MMT eval  Internal Anal Sphincter 3/5  External Anal Sphincter 3/5  Puborectalis 3/5  (Blank rows = not tested)         TONE: increased   PROLAPSE: rectocele   TODAY'S TREATMENT:  11/09/22 Manual: Internal pelvic floor techniques: No emotional/communication barriers or cognitive limitation. Patient is motivated to learn. Patient understands and agrees with treatment goals and plan. PT explains patient will be examined in standing, sitting, and lying down to  see how their muscles and joints work. When they are ready, they will be asked to remove their underwear so PT can examine their perineum. The patient is also given the option of providing their own chaperone as one is not provided in our facility. The patient also has the right and is explained the right to defer or refuse any part of the evaluation or treatment including the internal exam. With the patient's consent, PT will use one gloved finger to gently assess the muscles of the pelvic floor, seeing how well it contracts and relaxes and if there is muscle symmetry. After, the patient will get dressed and PT and patient will discuss exam findings and plan of care. PT and patient discuss plan of care, schedule, attendance policy and HEP activities.  Going through the rectum to work on the puborectalis and ischiococcygeus with pelvic movement  Exercises: Stretches/mobility: Cat camel 15 x Quadruped hip hinge with hips internally rotated Z stretch leaning forward then lift up and reach upward  Strengthening: Recumbent bike level 4 for 5 minutes while the therapist assesses the patient.   10/22/22 Manual: Soft tissue mobilization: Educated patient on which vaginal massage wand would be good to use for internal work Educated  Internal pelvic floor techniques: No emotional/communication barriers or cognitive limitation. Patient is motivated to learn. Patient understands and agrees with treatment goals and plan. PT explains patient will be examined in standing, sitting, and lying down to see how their muscles and joints work. When they are ready, they will be asked to remove their underwear so PT can examine their perineum. The patient is also given the option of providing their own chaperone as one is not provided in our facility. The patient also has  the right and is explained the right to defer or refuse any part of the evaluation or treatment including the internal exam. With the patient's consent,  PT will use one gloved finger to gently assess the muscles of the pelvic floor, seeing how well it contracts and relaxes and if there is muscle symmetry. After, the patient will get dressed and PT and patient will discuss exam findings and plan of care. PT and patient discuss plan of care, schedule, attendance policy and HEP activities.  Manual work to the puborectalis, along the iliococcygeus and obturator internist, and coccygeus.  Exercises: Stretches/mobility: Diaphragmatic breathing to elongate the pelvic floor.  Strengthening: Recumbent bike level 3 for 5 min. While assessing patient       PATIENT EDUCATION: 11/09/22 Education details: Access Code: , information on dry needling Person educated: Patient Education method: Explanation, Demonstration, Tactile cues, Verbal cues, and Handouts Education comprehension: verbalized understanding, returned demonstration, verbal cues required, tactile cues required, and needs further education     HOME EXERCISE PROGRAM: 11/09/22 Access Code: URL: [email or web address] Date: 11/09/2022 Prepared by: Eulis Foster  Exercises - Cat Cow  - 1 x daily - 7 x weekly - 1 sets - 2 reps - Hip Hinge Rock Back  - 1 x daily - 7 x weekly - 1 sets - 2 reps - Pigeon Pose  - 1 x daily - 7 x weekly - 1 sets - 2 reps  Patient Education - Trigger Point Dry Needling   ASSESSMENT:   CLINICAL IMPRESSION: Patient is a 47 y.o. female  who was seen today for physical therapy evaluation and treatment for pelvic and perineal pain.  Patient had 2 days with no pain. She will be getting the rectal wand over the weekend and will try it on the pelvic floor. She has more tightness on the left puborectalis than the right. Patient feels spasms in the hip adductors.  Patient would benefit from skilled therapy to improve pain management and reduction of trigger points in the pelvic floor muscles to reduce her pain.    OBJECTIVE IMPAIRMENTS:  decreased coordination, decreased strength, increased muscle spasms, and pain.    ACTIVITY LIMITATIONS: toileting   PARTICIPATION LIMITATIONS: community activity and occupation   PERSONAL FACTORS: Time since onset of injury/illness/exacerbation and 1-2 comorbidities: IBS, Rectocele  are also affecting patient's functional outcome.    REHAB POTENTIAL: Good   CLINICAL DECISION MAKING: Stable/uncomplicated   EVALUATION COMPLEXITY: Low     GOALS: Goals reviewed with patient? Yes   SHORT TERM GOALS: Target date: 10/29/22   Patient educated on vaginal moisturizers and to moisturize the rectum.  Baseline: Goal status: Met 11/09/22   2.  Patient reports her pelvic pain decreased </= 25% due to reduction of pelvic floor trigger points.  Baseline:  Goal status: INITIAL   3.  Patient understands ways to manage her stress and pelvic pain.  Baseline:  Goal status: INITIAL     LONG TERM GOALS: Target date: 12/24/22   Patient independent with advanced HEP for manual work on pelvic floor and ways to elongate the pelvic floor.  Baseline:  Goal status: INITIAL   2.  Patient reports her pelvic floor pain decreased </= 75% due to reduction of trigger points.  Baseline:  Goal status: INITIAL   3.  Patient is able to use the rectal wand to reduce her trigger points when she is in increased pain.  Baseline:  Goal status: INITIAL   4.  Patient is able to fully  relax her pelvic floor with a bowel movement so the stool is not stuck at the entrance.  Baseline:  Goal status: INITIAL       PLAN:   PT FREQUENCY: 1x/week   PT DURATION: 12 weeks   PLANNED INTERVENTIONS: Therapeutic exercises, Therapeutic activity, Neuromuscular re-education, Patient/Family education, Joint mobilization, Dry Needling, Electrical stimulation, Spinal mobilization, Cryotherapy, Moist heat, Taping, Ultrasound, Biofeedback, and Manual therapy   PLAN FOR NEXT SESSION: manual work to the pelvic floor on the left,  frog stretch, dry needling to the left puborectalis and bilateral hip adductors.  diaphragmatic breathing   Eulis FosterCheryl Celso Granja, PT 11/09/22 8:47 AM

## 2022-11-16 ENCOUNTER — Ambulatory Visit: Payer: BC Managed Care – PPO | Admitting: Physical Therapy

## 2022-11-16 ENCOUNTER — Encounter: Payer: Self-pay | Admitting: Physical Therapy

## 2022-11-16 DIAGNOSIS — R102 Pelvic and perineal pain: Secondary | ICD-10-CM

## 2022-11-16 DIAGNOSIS — R252 Cramp and spasm: Secondary | ICD-10-CM | POA: Diagnosis not present

## 2022-11-16 NOTE — Therapy (Signed)
OUTPATIENT PHYSICAL THERAPY TREATMENT NOTE   Patient Name: Kristine James MRN: [id number] DOB:Sep 08, 1975, 47 y.o., female Today's Date: 11/16/2022  PCP: Kathreen Cosier, MD  REFERRING PROVIDER: Arby Barrette, FNP   END OF SESSION:   PT End of Session - 11/16/22 0807     Visit Number 4    Date for PT Re-Evaluation 12/24/22    Authorization Type BCBS    PT Start Time 0800    PT Stop Time 0840    PT Time Calculation (min) 40 min    Activity Tolerance Patient tolerated treatment well    Behavior During Therapy [facility] for tasks assessed/performed             History reviewed. No pertinent past medical history. Past Surgical History:  Procedure Laterality Date   rotator cuff surgery Right    There are no problems to display for this patient.  REFERRING DIAG: R10.2 (ICD-10-CM) - Pelvic and perineal pain    THERAPY DIAG:  Cramp and spasm   Pelvic pain   Rationale for Evaluation and Treatment: Rehabilitation   ONSET DATE: 2020   SUBJECTIVE:                                                                                                                                                                                            SUBJECTIVE STATEMENT: I have increased pressure at school so my pain is increasing. I have the wand now.    Fluid intake: Yes: drink water     PAIN:  Are you having pain? Yes NPRS scale: 8/10 Pain location:  in inner thighs, pelvic floor   Pain type: fire, cramp, tightness Pain description: intermittent    Aggravating factors: stress, stool being stuck at the sphincter Relieving factors: sleep, swimming pool, working out, body combat, zumba, body pump, sitting in warm water   PRECAUTIONS: None   WEIGHT BEARING RESTRICTIONS: No   FALLS:  Has patient fallen in last 6 months? No   LIVING ENVIRONMENT: Lives with: lives alone   OCCUPATION: Patient works in a lab, sitting   PLOF: Independent   PATIENT GOALS: reduce pain    PERTINENT HISTORY:  IBS, Rectocele Sexual abuse: No   BOWEL MOVEMENT: Pain with bowel movement: Yes Type of bowel movement:Type (Bristol Stool Scale) Type 4 and 5, Frequency daily, Strain No, and Splinting yes Fully empty rectum: No, residual stool in rectum Leakage: No Pads: No Fiber supplement: Yes: natural diet   URINATION: Pain with urination: No Fully empty bladder: No, when have rectal pain Stream: Strong Urgency: No Frequency: average Leakage: Laughing     PROLAPSE: Rectocele  OBJECTIVE:    COGNITION: Overall cognitive status: Within functional limits for tasks assessed                          SENSATION: Light touch: Appears intact Proprioception: Appears intact     POSTURE: No Significant postural limitations   PELVIC ALIGNMENT: correct alignment   LUMBARAROM/PROM:   A/PROM A/PROM  eval  Extension Decreased by 25%  Right lateral flexion Decreased by 25%    LOWER EXTREMITY ROM: Bilateral hip ROM is full     LOWER EXTREMITY MMT:   MMT Right eval Left eval  Hip extension 4/5 4/5  Hip abduction 4/5 4/5  Hip adduction 4/5 4/5    PALPATION:   General  Tenderness located on right lower abdominal, bilateral hip adductors                 External Perineal Exam tenderness located on the perineal body                             Internal Pelvic Floor tenderness located on bilateral levator ani and obturator internist   Patient confirms identification and approves PT to assess internal pelvic floor and treatment Yes   PELVIC MMT:   MMT eval  Internal Anal Sphincter 3/5  External Anal Sphincter 3/5  Puborectalis 3/5  (Blank rows = not tested)         TONE: increased   PROLAPSE: rectocele   TODAY'S TREATMENT:  11/16/22 Manual: Internal pelvic floor techniques: No emotional/communication barriers or cognitive limitation. Patient is motivated to learn. Patient understands and agrees with treatment goals and plan. PT explains patient  will be examined in standing, sitting, and lying down to see how their muscles and joints work. When they are ready, they will be asked to remove their underwear so PT can examine their perineum. The patient is also given the option of providing their own chaperone as one is not provided in our facility. The patient also has the right and is explained the right to defer or refuse any part of the evaluation or treatment including the internal exam. With the patient's consent, PT will use one gloved finger to gently assess the muscles of the pelvic floor, seeing how well it contracts and relaxes and if there is muscle symmetry. After, the patient will get dressed and PT and patient will discuss exam findings and plan of care. PT and patient discuss plan of care, schedule, attendance policy and HEP activities.  Going through the rectum manually working on the puborectalis, pubococcygeus, iliococcygeus, anococcygeal ligament, and distraction of the coccyx Educated patient on how to use the rectal wand on her pelvic floor using the massage component and she was able to demonstrate it.  Exercises: Strengthening: Recumbent bike level 4 for 5 minutes while the therapist assesses the patient.  11/09/22 Manual: Internal pelvic floor techniques: No emotional/communication barriers or cognitive limitation. Patient is motivated to learn. Patient understands and agrees with treatment goals and plan. PT explains patient will be examined in standing, sitting, and lying down to see how their muscles and joints work. When they are ready, they will be asked to remove their underwear so PT can examine their perineum. The patient is also given the option of providing their own chaperone as one is not provided in our facility. The patient also has the right and is explained the right to defer or refuse  any part of the evaluation or treatment including the internal exam. With the patient's consent, PT will use one gloved finger to  gently assess the muscles of the pelvic floor, seeing how well it contracts and relaxes and if there is muscle symmetry. After, the patient will get dressed and PT and patient will discuss exam findings and plan of care. PT and patient discuss plan of care, schedule, attendance policy and HEP activities.  Going through the rectum to work on the puborectalis and ischiococcygeus with pelvic movement   Exercises: Stretches/mobility: Cat camel 15 x Quadruped hip hinge with hips internally rotated Z stretch leaning forward then lift up and reach upward  Strengthening: Recumbent bike level 4 for 5 minutes while the therapist assesses the patient.    10/22/22 Manual: Soft tissue mobilization: Educated patient on which vaginal massage wand would be good to use for internal work Educated  Internal pelvic floor techniques: No emotional/communication barriers or cognitive limitation. Patient is motivated to learn. Patient understands and agrees with treatment goals and plan. PT explains patient will be examined in standing, sitting, and lying down to see how their muscles and joints work. When they are ready, they will be asked to remove their underwear so PT can examine their perineum. The patient is also given the option of providing their own chaperone as one is not provided in our facility. The patient also has the right and is explained the right to defer or refuse any part of the evaluation or treatment including the internal exam. With the patient's consent, PT will use one gloved finger to gently assess the muscles of the pelvic floor, seeing how well it contracts and relaxes and if there is muscle symmetry. After, the patient will get dressed and PT and patient will discuss exam findings and plan of care. PT and patient discuss plan of care, schedule, attendance policy and HEP activities.  Manual work to the puborectalis, along the iliococcygeus and obturator internist, and coccygeus.   Exercises: Stretches/mobility: Diaphragmatic breathing to elongate the pelvic floor.  Strengthening: Recumbent bike level 3 for 5 min. While assessing patient       PATIENT EDUCATION: 11/16/22 Education details: Access Code: , information on dry needling, educated patient on how to use rectal wand internally on the pelvic floor muscles and using the massage component.  Person educated: Patient Education method: Explanation, Demonstration, Tactile cues, Verbal cues, and Handouts Education comprehension: verbalized understanding, returned demonstration, verbal cues required, tactile cues required, and needs further education     HOME EXERCISE PROGRAM: 11/16/22 Access Code: URL: [email or web address] Date: 11/09/2022 Prepared by: Eulis Foster   Exercises - Cat Cow  - 1 x daily - 7 x weekly - 1 sets - 2 reps - Hip Hinge Rock Back  - 1 x daily - 7 x weekly - 1 sets - 2 reps - Pigeon Pose  - 1 x daily - 7 x weekly - 1 sets - 2 reps   Patient Education - Trigger Point Dry Needling   ASSESSMENT:   CLINICAL IMPRESSION: Patient is a 47 y.o. female  who was seen today for physical therapy evaluation and treatment for pelvic and perineal pain.   Patient is understanding ways to manage her stress level. She was educated on how to massage her pelvic floor muscles through the rectum using the massage component. Patient felt the massage part of the wand has helped relax the pelvic floor muscles. Patient would benefit from skilled therapy to improve pain management  and reduction of trigger points in the pelvic floor muscles to reduce her pain.    OBJECTIVE IMPAIRMENTS: decreased coordination, decreased strength, increased muscle spasms, and pain.    ACTIVITY LIMITATIONS: toileting   PARTICIPATION LIMITATIONS: community activity and occupation   PERSONAL FACTORS: Time since onset of injury/illness/exacerbation and 1-2 comorbidities: IBS, Rectocele  are also  affecting patient's functional outcome.    REHAB POTENTIAL: Good   CLINICAL DECISION MAKING: Stable/uncomplicated   EVALUATION COMPLEXITY: Low     GOALS: Goals reviewed with patient? Yes   SHORT TERM GOALS: Target date: 10/29/22   Patient educated on vaginal moisturizers and to moisturize the rectum.  Baseline: Goal status: Met 11/09/22   2.  Patient reports her pelvic pain decreased </= 25% due to reduction of pelvic floor trigger points.  Baseline:  Goal status: INITIAL   3.  Patient understands ways to manage her stress and pelvic pain.  Baseline:  Goal status: Met 11/16/22     LONG TERM GOALS: Target date: 12/24/22   Patient independent with advanced HEP for manual work on pelvic floor and ways to elongate the pelvic floor.  Baseline:  Goal status: INITIAL   2.  Patient reports her pelvic floor pain decreased </= 75% due to reduction of trigger points.  Baseline:  Goal status: INITIAL   3.  Patient is able to use the rectal wand to reduce her trigger points when she is in increased pain.  Baseline:  Goal status: INITIAL   4.  Patient is able to fully relax her pelvic floor with a bowel movement so the stool is not stuck at the entrance.  Baseline:  Goal status: INITIAL       PLAN:   PT FREQUENCY: 1x/week   PT DURATION: 12 weeks   PLANNED INTERVENTIONS: Therapeutic exercises, Therapeutic activity, Neuromuscular re-education, Patient/Family education, Joint mobilization, Dry Needling, Electrical stimulation, Spinal mobilization, Cryotherapy, Moist heat, Taping, Ultrasound, Biofeedback, and Manual therapy   PLAN FOR NEXT SESSION: manual work to the pelvic floor on the left, frog stretch, dry needling to the left puborectalis and bilateral hip adductors.  diaphragmatic breathing, see how the pelvic wand is going.   Eulis Foster, PT 11/16/22 8:44 AM

## 2022-11-19 ENCOUNTER — Ambulatory Visit: Payer: BC Managed Care – PPO | Admitting: Physical Therapy

## 2022-11-19 ENCOUNTER — Encounter: Payer: Self-pay | Admitting: Physical Therapy

## 2022-11-19 DIAGNOSIS — R252 Cramp and spasm: Secondary | ICD-10-CM

## 2022-11-19 DIAGNOSIS — R102 Pelvic and perineal pain: Secondary | ICD-10-CM

## 2022-11-19 NOTE — Therapy (Signed)
OUTPATIENT PHYSICAL THERAPY TREATMENT NOTE   Patient Name: Kristine James MRN: [id number] DOB:21-May-1976, 47 y.o., female Today's Date: 11/19/2022  PCP: Kathreen Cosier, MD  REFERRING PROVIDER: Arby Barrette, FNP   END OF SESSION:   PT End of Session - 11/19/22 0851     Visit Number 5    Date for PT Re-Evaluation 12/24/22    Authorization Type [employer]    PT Start Time 0850   late   PT Stop Time 0928    PT Time Calculation (min) 38 min    Activity Tolerance Patient tolerated treatment well    Behavior During Therapy [facility] for tasks assessed/performed             History reviewed. No pertinent past medical history. Past Surgical History:  Procedure Laterality Date   rotator cuff surgery Right    There are no problems to display for this patient.  REFERRING DIAG: R10.2 (ICD-10-CM) - Pelvic and perineal pain    THERAPY DIAG:  Cramp and spasm   Pelvic pain   Rationale for Evaluation and Treatment: Rehabilitation   ONSET DATE: 2020   SUBJECTIVE:                                                                                                                                                                                            SUBJECTIVE STATEMENT: The manual work helped last time. I have not done the wand yet. I am having left inner thigh discomfort.    PAIN:  Are you having pain? Yes NPRS scale: 0/10 today Pain location:  in inner thighs, pelvic floor   Pain type: fire, cramp, tightness Pain description: intermittent    Aggravating factors: stress, stool being stuck at the sphincter Relieving factors: sleep, swimming pool, working out, body combat, zumba, body pump, sitting in warm water   PRECAUTIONS: None   WEIGHT BEARING RESTRICTIONS: No   FALLS:  Has patient fallen in last 6 months? No   LIVING ENVIRONMENT: Lives with: lives alone   OCCUPATION: Patient works in a lab, sitting   PLOF: Independent   PATIENT GOALS: reduce pain    PERTINENT HISTORY:  IBS, Rectocele Sexual abuse: No   BOWEL MOVEMENT: Pain with bowel movement: Yes Type of bowel movement:Type (Bristol Stool Scale) Type 4 and 5, Frequency daily, Strain No, and Splinting yes Fully empty rectum: No, residual stool in rectum Leakage: No Pads: No Fiber supplement: Yes: natural diet   URINATION: Pain with urination: No Fully empty bladder: No, when have rectal pain Stream: Strong Urgency: No Frequency: average Leakage: Laughing     PROLAPSE: Rectocele  OBJECTIVE:    COGNITION: Overall cognitive status: Within functional limits for tasks assessed                          SENSATION: Light touch: Appears intact Proprioception: Appears intact     POSTURE: No Significant postural limitations   PELVIC ALIGNMENT: correct alignment   LUMBARAROM/PROM:   A/PROM A/PROM  eval  Extension Decreased by 25%  Right lateral flexion Decreased by 25%    LOWER EXTREMITY ROM: Bilateral hip ROM is full     LOWER EXTREMITY MMT:   MMT Right eval Left eval  Hip extension 4/5 4/5  Hip abduction 4/5 4/5  Hip adduction 4/5 4/5    PALPATION:   General  Tenderness located on right lower abdominal, bilateral hip adductors                 External Perineal Exam tenderness located on the perineal body                             Internal Pelvic Floor tenderness located on bilateral levator ani and obturator internist   Patient confirms identification and approves PT to assess internal pelvic floor and treatment Yes   PELVIC MMT:   MMT eval  Internal Anal Sphincter 3/5  External Anal Sphincter 3/5  Puborectalis 3/5  (Blank rows = not tested)         TONE: increased   PROLAPSE: rectocele   TODAY'S TREATMENT:  11/19/22 Manual: Soft tissue mobilization: To assess for dry needling Manual work to the left hip adductor to lengthen after dry needling.  Internal pelvic floor techniques: No emotional/communication barriers or  cognitive limitation. Patient is motivated to learn. Patient understands and agrees with treatment goals and plan. PT explains patient will be examined in standing, sitting, and lying down to see how their muscles and joints work. When they are ready, they will be asked to remove their underwear so PT can examine their perineum. The patient is also given the option of providing their own chaperone as one is not provided in our facility. The patient also has the right and is explained the right to defer or refuse any part of the evaluation or treatment including the internal exam. With the patient's consent, PT will use one gloved finger to gently assess the muscles of the pelvic floor, seeing how well it contracts and relaxes and if there is muscle symmetry. After, the patient will get dressed and PT and patient will discuss exam findings and plan of care. PT and patient discuss plan of care, schedule, attendance policy and HEP activities.  Going through the anus working on the puborectalis, along the iliococcygeus while patient was performing diaphragmatic breathing to lengthen the pelvic floor muscles.  While therapist finger in the rectum working with patient breathing correctly to push the therapist finger out of the rectum Trigger Point Dry-Needling  Treatment instructions: Expect mild to moderate muscle soreness. S/S of pneumothorax if dry needled over a lung field, and to seek immediate medical attention should they occur. Patient verbalized understanding of these instructions and education.  Patient Consent Given: Yes Education handout provided: Yes Muscles treated: left hip adductor Electrical stimulation performed: No Parameters: N/A Treatment response/outcome: elongation of muscle and trigger point response  Exercises: Stretches/mobility: Flow from childs pose to prone press up 5 x Lean forward then Z stretch leaning forward then lift up and reach  upward 5 x each  way Strengthening: Recumbent bike level 4 for 5 minutes while the therapist assesses the patient    11/16/22 Manual: Internal pelvic floor techniques: No emotional/communication barriers or cognitive limitation. Patient is motivated to learn. Patient understands and agrees with treatment goals and plan. PT explains patient will be examined in standing, sitting, and lying down to see how their muscles and joints work. When they are ready, they will be asked to remove their underwear so PT can examine their perineum. The patient is also given the option of providing their own chaperone as one is not provided in our facility. The patient also has the right and is explained the right to defer or refuse any part of the evaluation or treatment including the internal exam. With the patient's consent, PT will use one gloved finger to gently assess the muscles of the pelvic floor, seeing how well it contracts and relaxes and if there is muscle symmetry. After, the patient will get dressed and PT and patient will discuss exam findings and plan of care. PT and patient discuss plan of care, schedule, attendance policy and HEP activities.  Going through the rectum manually working on the puborectalis, pubococcygeus, iliococcygeus, anococcygeal ligament, and distraction of the coccyx Educated patient on how to use the rectal wand on her pelvic floor using the massage component and she was able to demonstrate it.  Exercises: Strengthening: Recumbent bike level 4 for 5 minutes while the therapist assesses the patient.   11/09/22 Manual: Internal pelvic floor techniques: No emotional/communication barriers or cognitive limitation. Patient is motivated to learn. Patient understands and agrees with treatment goals and plan. PT explains patient will be examined in standing, sitting, and lying down to see how their muscles and joints work. When they are ready, they will be asked to remove their underwear so PT can examine  their perineum. The patient is also given the option of providing their own chaperone as one is not provided in our facility. The patient also has the right and is explained the right to defer or refuse any part of the evaluation or treatment including the internal exam. With the patient's consent, PT will use one gloved finger to gently assess the muscles of the pelvic floor, seeing how well it contracts and relaxes and if there is muscle symmetry. After, the patient will get dressed and PT and patient will discuss exam findings and plan of care. PT and patient discuss plan of care, schedule, attendance policy and HEP activities.  Going through the rectum to work on the puborectalis and ischiococcygeus with pelvic movement   Exercises: Stretches/mobility: Cat camel 15 x Quadruped hip hinge with hips internally rotated Z stretch leaning forward then lift up and reach upward  Strengthening: Recumbent bike level 4 for 5 minutes while the therapist assesses the patient.      PATIENT EDUCATION: 11/16/22 Education details: Access Code: , information on dry needling, educated patient on how to use rectal wand internally on the pelvic floor muscles and using the massage component.  Person educated: Patient Education method: Explanation, Demonstration, Tactile cues, Verbal cues, and Handouts Education comprehension: verbalized understanding, returned demonstration, verbal cues required, tactile cues required, and needs further education     HOME EXERCISE PROGRAM: 11/16/22 Access Code: URL: [email or web address] Date: 11/09/2022 Prepared by: Eulis Foster   Exercises - Cat Cow  - 1 x daily - 7 x weekly - 1 sets - 2 reps - Hip Hinge Rock Back  - 1  x daily - 7 x weekly - 1 sets - 2 reps - Pigeon Pose  - 1 x daily - 7 x weekly - 1 sets - 2 reps   Patient Education - Trigger Point Dry Needling   ASSESSMENT:   CLINICAL IMPRESSION: Patient is a 48 y.o. female  who  was seen today for physical therapy evaluation and treatment for pelvic and perineal pain.   Patient is not having pelvic pain today due to the manual work.  Patient did well with the dry needling into the left hip adductor. She was able to expand the pelvic floor with diaphragmatic breathing at end of treatment and push the therapist finger out of the rectum. She will continue to need practice. Patient reports she still will use her finger to get the stool out of the rectum. Patient would benefit from skilled therapy to improve pain management and reduction of trigger points in the pelvic floor muscles to reduce her pain.    OBJECTIVE IMPAIRMENTS: decreased coordination, decreased strength, increased muscle spasms, and pain.    ACTIVITY LIMITATIONS: toileting   PARTICIPATION LIMITATIONS: community activity and occupation   PERSONAL FACTORS: Time since onset of injury/illness/exacerbation and 1-2 comorbidities: IBS, Rectocele  are also affecting patient's functional outcome.    REHAB POTENTIAL: Good   CLINICAL DECISION MAKING: Stable/uncomplicated   EVALUATION COMPLEXITY: Low     GOALS: Goals reviewed with patient? Yes   SHORT TERM GOALS: Target date: 10/29/22   Patient educated on vaginal moisturizers and to moisturize the rectum.  Baseline: Goal status: Met 11/09/22   2.  Patient reports her pelvic pain decreased </= 25% due to reduction of pelvic floor trigger points.  Baseline:  Goal status: Ongoing 11/19/22   3.  Patient understands ways to manage her stress and pelvic pain.  Baseline:  Goal status: Met 11/16/22     LONG TERM GOALS: Target date: 12/24/22   Patient independent with advanced HEP for manual work on pelvic floor and ways to elongate the pelvic floor.  Baseline:  Goal status: INITIAL   2.  Patient reports her pelvic floor pain decreased </= 75% due to reduction of trigger points.  Baseline:  Goal status: INITIAL   3.  Patient is able to use the rectal wand to  reduce her trigger points when she is in increased pain.  Baseline:  Goal status: Met 11/19/22   4.  Patient is able to fully relax her pelvic floor with a bowel movement so the stool is not stuck at the entrance.  Baseline:  Goal status: INITIAL       PLAN:   PT FREQUENCY: 1x/week   PT DURATION: 12 weeks   PLANNED INTERVENTIONS: Therapeutic exercises, Therapeutic activity, Neuromuscular re-education, Patient/Family education, Joint mobilization, Dry Needling, Electrical stimulation, Spinal mobilization, Cryotherapy, Moist heat, Taping, Ultrasound, Biofeedback, and Manual therapy   PLAN FOR NEXT SESSION: manual work to the pelvic floor on the left, frog stretch, dry needling to the left hip adductors.  diaphragmatic breathing, see how the pelvic wand is going. Work on pushing the therapist finger out of the rectum   Eulis Foster, PT 11/19/22 9:31 AM

## 2022-11-19 NOTE — Patient Instructions (Signed)
Toileting Techniques for Bowel Movements (Defecation) Using your belly (abdomen) and pelvic floor muscles to have a bowel movement is usually instinctive.  Sometimes people can have problems with these muscles and have to relearn proper defecation (emptying) techniques.  If you have weakness in your muscles, organs that are falling out, decreased sensation in your pelvis, or ignore your urge to go, you may find yourself straining to have a bowel movement.  You are straining if you are: holding your breath or taking in a huge gulp of air and holding it  keeping your lips and jaw tensed and closed tightly turning red in the face because of excessive pushing or forcing developing or worsening your  hemorrhoids getting faint while pushing not emptying completely and have to defecate many times a day  If you are straining, you are actually making it harder for yourself to have a bowel movement.  Many people find they are pulling up with the pelvic floor muscles and closing off instead of opening the anus. Due to lack pelvic floor relaxation and coordination the abdominal muscles, one has to work harder to push the feces out.  Many people have never been taught how to defecate efficiently and effectively.  Notice what happens to your body when you are having a bowel movement.  While you are sitting on the toilet pay attention to the following areas: Jaw and mouth position Angle of your hips   Whether your feet touch the ground or not Arm placement  Spine position Waist Belly tension Anus (opening of the anal canal)  An Evacuation/Defecation Plan   Here are the 4 basic points:  Lean forward enough for your elbows to rest on your knees Support your feet on the floor or use a low stool if your feet don't touch the floor  Push out your belly as if you have swallowed a beach ball--you should feel a widening of your waist Open and relax your pelvic floor muscles, rather than tightening around the  anus       The following conditions my require modifications to your toileting posture:  If you have had surgery in the past that limits your back, hip, pelvic, knee or ankle flexibility Constipation   Your healthcare practitioner may make the following additional suggestions and adjustments:  Sit on the toilet  a) Make sure your feet are supported. b) Notice your hip angle and spine position--most people find it effective to lean forward or raise their knees, which can help the muscles around the anus to relax  c) When you lean forward, place your forearms on your thighs for support  Relax suggestions a) Breath deeply in through your nose and out slowly through your mouth as if you are smelling the flowers and blowing out the candles. b) To become aware of how to relax your muscles, contracting and releasing muscles can be helpful.  Pull your pelvic floor muscles in tightly by using the image of holding back gas, or closing around the anus (visualize making a circle smaller) and lifting the anus up and in.  Then release the muscles and your anus should drop down and feel open. Repeat 5 times ending with the feeling of relaxation. c) Keep your pelvic floor muscles relaxed; let your belly bulge out. d) The digestive tract starts at the mouth and ends at the anal opening, so be sure to relax both ends of the tube.  Place your tongue on the roof of your mouth with your teeth  separated.  This helps relax your mouth and will help to relax the anus at the same time.  Empty (defecation) a) Keep your pelvic floor and sphincter relaxed, then bulge your anal muscles.  Make the anal opening wide.  b) Stick your belly out as if you have swallowed a beach ball. c) Make your belly wall hard using your belly muscles while continuing to breathe. Doing this makes it easier to open your anus. d) Breath out and give a grunt (or try using other sounds such as ahhhh, shhhhh, ohhhh or  grrrrrrr).  4) Finish a) As you finish your bowel movement, pull the pelvic floor muscles up and in.  This will leave your anus in the proper place rather than remaining pushed out and down. If you leave your anus pushed out and down, it will start to feel as though that is normal and give you incorrect signals about needing to have a bowel movement.   Trigger Point Dry Needling  What is Trigger Point Dry Needling (DN)? DN is a physical therapy technique used to treat muscle pain and dysfunction. Specifically, DN helps deactivate muscle trigger points (muscle knots).  A thin filiform needle is used to penetrate the skin and stimulate the underlying trigger point. The goal is for a local twitch response (LTR) to occur and for the trigger point to relax. No medication of any kind is injected during the procedure.   What Does Trigger Point Dry Needling Feel Like?  The procedure feels different for each individual patient. Some patients report that they do not actually feel the needle enter the skin and overall the process is not painful. Very mild bleeding may occur. However, many patients feel a deep cramping in the muscle in which the needle was inserted. This is the local twitch response.   How Will I feel after the treatment? Soreness is normal, and the onset of soreness may not occur for a few hours. Typically this soreness does not last longer than two days.  Bruising is uncommon, however; ice can be used to decrease any possible bruising.  In rare cases feeling tired or nauseous after the treatment is normal. In addition, your symptoms may get worse before they get better, this period will typically not last longer than 24 hours.   What Can I do After My Treatment? Increase your hydration by drinking more water for the next 24 hours. You may place ice or heat on the areas treated that have become sore, however, do not use heat on inflamed or bruised areas. Heat often brings more relief post  needling. You can continue your regular activities, but vigorous activity is not recommended initially after the treatment for 24 hours. DN is best combined with other physical therapy such as strengthening, stretching, and other therapies.  [facility]  [address] [place] [address].  [phone]

## 2022-11-30 ENCOUNTER — Encounter: Payer: Self-pay | Admitting: Physical Therapy

## 2022-11-30 ENCOUNTER — Ambulatory Visit: Payer: BC Managed Care – PPO | Admitting: Physical Therapy

## 2022-11-30 DIAGNOSIS — R252 Cramp and spasm: Secondary | ICD-10-CM | POA: Diagnosis not present

## 2022-11-30 DIAGNOSIS — R102 Pelvic and perineal pain: Secondary | ICD-10-CM

## 2022-11-30 NOTE — Therapy (Signed)
OUTPATIENT PHYSICAL THERAPY TREATMENT NOTE   Patient Name: Kristine James MRN: [id number] DOB:[birth year], 47 y.o., female Today's Date: 11/30/2022  PCP:  Kathreen Cosier, MD  REFERRING PROVIDER: Arby Barrette, FNP   END OF SESSION:   PT End of Session - 11/30/22 0804     Visit Number 6    Date for PT Re-Evaluation 12/24/22    Authorization Type BCBS    PT Start Time 0800    PT Stop Time 0845    PT Time Calculation (min) 45 min    Activity Tolerance Patient tolerated treatment well    Behavior During Therapy [facility] for tasks assessed/performed             History reviewed. No pertinent past medical history. Past Surgical History:  Procedure Laterality Date   rotator cuff surgery Right    There are no problems to display for this patient.  REFERRING DIAG: R10.2 (ICD-10-CM) - Pelvic and perineal pain    THERAPY DIAG:  Cramp and spasm   Pelvic pain   Rationale for Evaluation and Treatment: Rehabilitation   ONSET DATE: 2020   SUBJECTIVE:                                                                                                                                                                                            SUBJECTIVE STATEMENT: I feel like stool is stuck in my rectum when I have pain on the inner thigh and it goes to the pelvic floor. I try to do the breathing to get the stool out but I have to use my finger.     PAIN:  Are you having pain? Yes NPRS scale: 0/10 today Pain location:  in inner thighs, pelvic floor   Pain type: fire, cramp, tightness Pain description: intermittent    Aggravating factors: stress, stool being stuck at the sphincter Relieving factors: sleep, swimming pool, working out, body combat, zumba, body pump, sitting in warm water   PRECAUTIONS: None   WEIGHT BEARING RESTRICTIONS: No   FALLS:  Has patient fallen in last 6 months? No   LIVING ENVIRONMENT: Lives with: lives alone   OCCUPATION: Patient works in a  lab, sitting   PLOF: Independent   PATIENT GOALS: reduce pain   PERTINENT HISTORY:  IBS, Rectocele Sexual abuse: No   BOWEL MOVEMENT: Pain with bowel movement: Yes Type of bowel movement:Type (Bristol Stool Scale) Type 4 and 5, Frequency daily, Strain No, and Splinting yes Fully empty rectum: No, residual stool in rectum Leakage: No Pads: No Fiber supplement: Yes: natural diet   URINATION: Pain with urination: No Fully empty bladder: No,  when have rectal pain Stream: Strong Urgency: No Frequency: average Leakage: Laughing     PROLAPSE: Rectocele       OBJECTIVE:    COGNITION: Overall cognitive status: Within functional limits for tasks assessed                          SENSATION: Light touch: Appears intact Proprioception: Appears intact     POSTURE: No Significant postural limitations   PELVIC ALIGNMENT: correct alignment   LUMBARAROM/PROM:   A/PROM A/PROM  eval  Extension Decreased by 25%  Right lateral flexion Decreased by 25%    LOWER EXTREMITY ROM: Bilateral hip ROM is full     LOWER EXTREMITY MMT:   MMT Right eval Left eval  Hip extension 4/5 4/5  Hip abduction 4/5 4/5  Hip adduction 4/5 4/5    PALPATION:   General  Tenderness located on right lower abdominal, bilateral hip adductors                 External Perineal Exam tenderness located on the perineal body                             Internal Pelvic Floor tenderness located on bilateral levator ani and obturator internist   Patient confirms identification and approves PT to assess internal pelvic floor and treatment Yes   PELVIC MMT:   MMT eval  Internal Anal Sphincter 3/5  External Anal Sphincter 3/5  Puborectalis 3/5  (Blank rows = not tested)         TONE: increased   PROLAPSE: rectocele   TODAY'S TREATMENT:  11/30/22 Manual: Soft tissue mobilization: Manual work to the hip adductors, quadriceps, and ITB Myofascial release: Using the suction cup on the hip  adductors, quadriceps, ITB to release the fascia Neuromuscular re-education: Down training: Education on ways to reduce stress during her day and during toileting to relax the pelvic floor and reduce pain Education on going to the bathroom when she has the urge for a bowel movement instead of using pain as the indicator for having a bowel movement Education on using the suction cup on the thighs to release the fascial restrictions which will relax the pelvic floor Education on using the rectal wand three times per week instead of when she has pain to keep the pelvic floor muscles relaxed.  Exercises: Stretches/mobility: Foam roll to the hip adductors with verbal cues to go slowly and work on relaxing the muscles Foam roll the quads   11/19/22 Manual: Soft tissue mobilization: To assess for dry needling Manual work to the left hip adductor to lengthen after dry needling.  Internal pelvic floor techniques: No emotional/communication barriers or cognitive limitation. Patient is motivated to learn. Patient understands and agrees with treatment goals and plan. PT explains patient will be examined in standing, sitting, and lying down to see how their muscles and joints work. When they are ready, they will be asked to remove their underwear so PT can examine their perineum. The patient is also given the option of providing their own chaperone as one is not provided in our facility. The patient also has the right and is explained the right to defer or refuse any part of the evaluation or treatment including the internal exam. With the patient's consent, PT will use one gloved finger to gently assess the muscles of the pelvic floor, seeing how well it contracts and relaxes  and if there is muscle symmetry. After, the patient will get dressed and PT and patient will discuss exam findings and plan of care. PT and patient discuss plan of care, schedule, attendance policy and HEP activities.  Going through the  anus working on the puborectalis, along the iliococcygeus while patient was performing diaphragmatic breathing to lengthen the pelvic floor muscles.  While therapist finger in the rectum working with patient breathing correctly to push the therapist finger out of the rectum Trigger Point Dry-Needling  Treatment instructions: Expect mild to moderate muscle soreness. S/S of pneumothorax if dry needled over a lung field, and to seek immediate medical attention should they occur. Patient verbalized understanding of these instructions and education.   Patient Consent Given: Yes Education handout provided: Yes Muscles treated: left hip adductor Electrical stimulation performed: No Parameters: N/A Treatment response/outcome: elongation of muscle and trigger point response   Exercises: Stretches/mobility: Flow from childs pose to prone press up 5 x Lean forward then Z stretch leaning forward then lift up and reach upward 5 x each way Strengthening: Recumbent bike level 4 for 5 minutes while the therapist assesses the patient     11/16/22 Manual: Internal pelvic floor techniques: No emotional/communication barriers or cognitive limitation. Patient is motivated to learn. Patient understands and agrees with treatment goals and plan. PT explains patient will be examined in standing, sitting, and lying down to see how their muscles and joints work. When they are ready, they will be asked to remove their underwear so PT can examine their perineum. The patient is also given the option of providing their own chaperone as one is not provided in our facility. The patient also has the right and is explained the right to defer or refuse any part of the evaluation or treatment including the internal exam. With the patient's consent, PT will use one gloved finger to gently assess the muscles of the pelvic floor, seeing how well it contracts and relaxes and if there is muscle symmetry. After, the patient will get  dressed and PT and patient will discuss exam findings and plan of care. PT and patient discuss plan of care, schedule, attendance policy and HEP activities.  Going through the rectum manually working on the puborectalis, pubococcygeus, iliococcygeus, anococcygeal ligament, and distraction of the coccyx Educated patient on how to use the rectal wand on her pelvic floor using the massage component and she was able to demonstrate it.  Exercises: Strengthening: Recumbent bike level 4 for 5 minutes while the therapist assesses the patient.       PATIENT EDUCATION: 11/16/22 Education details: Access Code: , information on dry needling, educated patient on how to use rectal wand internally on the pelvic floor muscles and using the massage component.  Person educated: Patient Education method: Explanation, Demonstration, Tactile cues, Verbal cues, and Handouts Education comprehension: verbalized understanding, returned demonstration, verbal cues required, tactile cues required, and needs further education     HOME EXERCISE PROGRAM: 11/16/22 Access Code: URL: [email or web address] Date: 11/09/2022 Prepared by: Eulis Foster   Exercises - Cat Cow  - 1 x daily - 7 x weekly - 1 sets - 2 reps - Hip Hinge Rock Back  - 1 x daily - 7 x weekly - 1 sets - 2 reps - Pigeon Pose  - 1 x daily - 7 x weekly - 1 sets - 2 reps   Patient Education - Trigger Point Dry Needling   ASSESSMENT:   CLINICAL IMPRESSION: Patient is a 47 y.o.  female  who was seen today for physical therapy  treatment for pelvic and perineal pain.  Patient pain increases with her stress. She associates pain with having to go to the bathroom instead of the feeling of the urge to have a bowel movement. Patient has increased fascial tightness of the thighs. Her left hip adductor is especially tight. The thigh tightness can contribute to pelvic floor pain. She is going to use the rectal wand 3 times a week to  keep ahead of the pelvic pain and keep the muscles relaxed. Patient is learning ways to reduce stress because this contributes to pain. Patient would benefit from skilled therapy to improve pain management and reduction of trigger points in the pelvic floor muscles to reduce her pain.    OBJECTIVE IMPAIRMENTS: decreased coordination, decreased strength, increased muscle spasms, and pain.    ACTIVITY LIMITATIONS: toileting   PARTICIPATION LIMITATIONS: community activity and occupation   PERSONAL FACTORS: Time since onset of injury/illness/exacerbation and 1-2 comorbidities: IBS, Rectocele  are also affecting patient's functional outcome.    REHAB POTENTIAL: Good   CLINICAL DECISION MAKING: Stable/uncomplicated   EVALUATION COMPLEXITY: Low     GOALS: Goals reviewed with patient? Yes   SHORT TERM GOALS: Target date: 10/29/22   Patient educated on vaginal moisturizers and to moisturize the rectum.  Baseline: Goal status: Met 11/09/22   2.  Patient reports her pelvic pain decreased </= 25% due to reduction of pelvic floor trigger points.  Baseline:  Goal status: Ongoing 11/19/22   3.  Patient understands ways to manage her stress and pelvic pain.  Baseline:  Goal status: Met 11/16/22     LONG TERM GOALS: Target date: 12/24/22   Patient independent with advanced HEP for manual work on pelvic floor and ways to elongate the pelvic floor.  Baseline:  Goal status: INITIAL   2.  Patient reports her pelvic floor pain decreased </= 75% due to reduction of trigger points.  Baseline:  Goal status: INITIAL   3.  Patient is able to use the rectal wand to reduce her trigger points when she is in increased pain.  Baseline:  Goal status: Met 11/19/22   4.  Patient is able to fully relax her pelvic floor with a bowel movement so the stool is not stuck at the entrance.  Baseline:  Goal status: INITIAL       PLAN:   PT FREQUENCY: 1x/week   PT DURATION: 12 weeks   PLANNED INTERVENTIONS:  Therapeutic exercises, Therapeutic activity, Neuromuscular re-education, Patient/Family education, Joint mobilization, Dry Needling, Electrical stimulation, Spinal mobilization, Cryotherapy, Moist heat, Taping, Ultrasound, Biofeedback, and Manual therapy   PLAN FOR NEXT SESSION: manual work to the pelvic floor on the left, frog stretch,  diaphragmatic breathing, see how the pelvic wand is going. Work on pushing the therapist finger out of the rectum  Eulis Foster, PT 11/30/22 9:01 AM

## 2022-12-05 ENCOUNTER — Ambulatory Visit: Payer: BC Managed Care – PPO | Attending: Obstetrics and Gynecology | Admitting: Physical Therapy

## 2022-12-05 ENCOUNTER — Encounter: Payer: Self-pay | Admitting: Physical Therapy

## 2022-12-05 DIAGNOSIS — R252 Cramp and spasm: Secondary | ICD-10-CM | POA: Insufficient documentation

## 2022-12-05 DIAGNOSIS — R102 Pelvic and perineal pain: Secondary | ICD-10-CM | POA: Insufficient documentation

## 2022-12-05 NOTE — Therapy (Signed)
OUTPATIENT PHYSICAL THERAPY TREATMENT NOTE   Patient Name: Kristine James MRN: [id number] DOB:10-29-1975, 47 y.o., female Today's Date: 12/05/2022  PCP: Kathreen Cosier, MD  REFERRING PROVIDER: Arby Barrette, FNP   END OF SESSION:   PT End of Session - 12/05/22 0804     Visit Number 7    Date for PT Re-Evaluation 12/24/22    Authorization Type BCBS    PT Start Time 0800    PT Stop Time 0840    PT Time Calculation (min) 40 min    Activity Tolerance Patient tolerated treatment well    Behavior During Therapy [facility] for tasks assessed/performed             History reviewed. No pertinent past medical history. Past Surgical History:  Procedure Laterality Date   rotator cuff surgery Right    There are no problems to display for this patient.  REFERRING DIAG: R10.2 (ICD-10-CM) - Pelvic and perineal pain    THERAPY DIAG:  Cramp and spasm   Pelvic pain   Rationale for Evaluation and Treatment: Rehabilitation   ONSET DATE: 2020   SUBJECTIVE:                                                                                                                                                                                            SUBJECTIVE STATEMENT: I have used the rectal wand 1 time and it helps. I have felt good after last visit. I had a full massage after session and it has helped.      PAIN:  Are you having pain? Yes NPRS scale: 0/10 today Pain location:  in inner thighs, pelvic floor   Pain type: fire, cramp, tightness Pain description: intermittent    Aggravating factors: stress, stool being stuck at the sphincter Relieving factors: sleep, swimming pool, working out, body combat, zumba, body pump, sitting in warm water   PRECAUTIONS: None   WEIGHT BEARING RESTRICTIONS: No   FALLS:  Has patient fallen in last 6 months? No   LIVING ENVIRONMENT: Lives with: lives alone   OCCUPATION: Patient works in a lab, sitting   PLOF: Independent   PATIENT  GOALS: reduce pain   PERTINENT HISTORY:  IBS, Rectocele Sexual abuse: No   BOWEL MOVEMENT: Pain with bowel movement: Yes Type of bowel movement:Type (Bristol Stool Scale) Type 4 and 5, Frequency daily, Strain No, and Splinting yes Fully empty rectum: No, residual stool in rectum Leakage: No Pads: No Fiber supplement: Yes: natural diet   URINATION: Pain with urination: No Fully empty bladder: No, when have rectal pain Stream: Strong Urgency: No Frequency: average Leakage: Laughing  PROLAPSE: Rectocele       OBJECTIVE:    COGNITION: Overall cognitive status: Within functional limits for tasks assessed                          SENSATION: Light touch: Appears intact Proprioception: Appears intact     POSTURE: No Significant postural limitations   PELVIC ALIGNMENT: correct alignment   LUMBARAROM/PROM:   A/PROM A/PROM  eval  Extension Decreased by 25%  Right lateral flexion Decreased by 25%    LOWER EXTREMITY ROM: Bilateral hip ROM is full     LOWER EXTREMITY MMT:   MMT Right eval Left eval  Hip extension 4/5 4/5  Hip abduction 4/5 4/5  Hip adduction 4/5 4/5    PALPATION:   General  Tenderness located on right lower abdominal, bilateral hip adductors                 External Perineal Exam tenderness located on the perineal body                             Internal Pelvic Floor tenderness located on bilateral levator ani and obturator internist   Patient confirms identification and approves PT to assess internal pelvic floor and treatment Yes   PELVIC MMT:   MMT eval 12/05/22  Internal Anal Sphincter 3/5 3/5  External Anal Sphincter 3/5 3/5  Puborectalis 3/5 3/5  (Blank rows = not tested)         TONE: increased   PROLAPSE: rectocele   TODAY'S TREATMENT:  12/05/22 Manual: Internal pelvic floor techniques: No emotional/communication barriers or cognitive limitation. Patient is motivated to learn. Patient understands and agrees with  treatment goals and plan. PT explains patient will be examined in standing, sitting, and lying down to see how their muscles and joints work. When they are ready, they will be asked to remove their underwear so PT can examine their perineum. The patient is also given the option of providing their own chaperone as one is not provided in our facility. The patient also has the right and is explained the right to defer or refuse any part of the evaluation or treatment including the internal exam. With the patient's consent, PT will use one gloved finger to gently assess the muscles of the pelvic floor, seeing how well it contracts and relaxes and if there is muscle symmetry. After, the patient will get dressed and PT and patient will discuss exam findings and plan of care. PT and patient discuss plan of care, schedule, attendance policy and HEP activities.  Going through the rectum working on the obturator internist, along the internal anal sphincter and external anal sphincter  Neuromuscular re-education: Pelvic floor contraction training: RUSI to the urogenital diaphragm working on pelvic floor contraction and relaxation like she is trying to push the stool out.  Exercises: Strengthening: Recumbent bike level 5 for 5 minutes.     11/30/22 Manual: Soft tissue mobilization: Manual work to the hip adductors, quadriceps, and ITB Myofascial release: Using the suction cup on the hip adductors, quadriceps, ITB to release the fascia Neuromuscular re-education: Down training: Education on ways to reduce stress during her day and during toileting to relax the pelvic floor and reduce pain Education on going to the bathroom when she has the urge for a bowel movement instead of using pain as the indicator for having a bowel movement Education on using the suction  cup on the thighs to release the fascial restrictions which will relax the pelvic floor Education on using the rectal wand three times per week  instead of when she has pain to keep the pelvic floor muscles relaxed.  Exercises: Stretches/mobility: Foam roll to the hip adductors with verbal cues to go slowly and work on relaxing the muscles Foam roll the quads    11/19/22 Manual: Soft tissue mobilization: To assess for dry needling Manual work to the left hip adductor to lengthen after dry needling.  Internal pelvic floor techniques: No emotional/communication barriers or cognitive limitation. Patient is motivated to learn. Patient understands and agrees with treatment goals and plan. PT explains patient will be examined in standing, sitting, and lying down to see how their muscles and joints work. When they are ready, they will be asked to remove their underwear so PT can examine their perineum. The patient is also given the option of providing their own chaperone as one is not provided in our facility. The patient also has the right and is explained the right to defer or refuse any part of the evaluation or treatment including the internal exam. With the patient's consent, PT will use one gloved finger to gently assess the muscles of the pelvic floor, seeing how well it contracts and relaxes and if there is muscle symmetry. After, the patient will get dressed and PT and patient will discuss exam findings and plan of care. PT and patient discuss plan of care, schedule, attendance policy and HEP activities.  Going through the anus working on the puborectalis, along the iliococcygeus while patient was performing diaphragmatic breathing to lengthen the pelvic floor muscles.  While therapist finger in the rectum working with patient breathing correctly to push the therapist finger out of the rectum Trigger Point Dry-Needling  Treatment instructions: Expect mild to moderate muscle soreness. S/S of pneumothorax if dry needled over a lung field, and to seek immediate medical attention should they occur. Patient verbalized understanding of these  instructions and education.   Patient Consent Given: Yes Education handout provided: Yes Muscles treated: left hip adductor Electrical stimulation performed: No Parameters: N/A Treatment response/outcome: elongation of muscle and trigger point response   Exercises: Stretches/mobility: Flow from childs pose to prone press up 5 x Lean forward then Z stretch leaning forward then lift up and reach upward 5 x each way Strengthening: Recumbent bike level 4 for 5 minutes while the therapist assesses the patient           PATIENT EDUCATION: 11/16/22 Education details: Access Code: , information on dry needling, educated patient on how to use rectal wand internally on the pelvic floor muscles and using the massage component.  Person educated: Patient Education method: Explanation, Demonstration, Tactile cues, Verbal cues, and Handouts Education comprehension: verbalized understanding, returned demonstration, verbal cues required, tactile cues required, and needs further education     HOME EXERCISE PROGRAM: 11/16/22 Access Code: URL: [email or web address] Date: 11/09/2022 Prepared by: Eulis Foster   Exercises - Cat Cow  - 1 x daily - 7 x weekly - 1 sets - 2 reps - Hip Hinge Rock Back  - 1 x daily - 7 x weekly - 1 sets - 2 reps - Pigeon Pose  - 1 x daily - 7 x weekly - 1 sets - 2 reps   Patient Education - Trigger Point Dry Needling   ASSESSMENT:   CLINICAL IMPRESSION: Patient is a 47 y.o. female  who was seen today for physical therapy  treatment for pelvic and perineal pain.  Patient has used the rectal wand one time and it helped with pain. She is able to bulge the pelvic floor with diaphragmatic breathing. She has increased tension of the external anal sphincter that has trouble relaxing. She still has some difficulty with pushing the therapist finger out of the rectal canal. Patient would benefit from skilled therapy to improve pain management and  reduction of trigger points in the pelvic floor muscles to reduce her pain.    OBJECTIVE IMPAIRMENTS: decreased coordination, decreased strength, increased muscle spasms, and pain.    ACTIVITY LIMITATIONS: toileting   PARTICIPATION LIMITATIONS: community activity and occupation   PERSONAL FACTORS: Time since onset of injury/illness/exacerbation and 1-2 comorbidities: IBS, Rectocele  are also affecting patient's functional outcome.    REHAB POTENTIAL: Good   CLINICAL DECISION MAKING: Stable/uncomplicated   EVALUATION COMPLEXITY: Low     GOALS: Goals reviewed with patient? Yes   SHORT TERM GOALS: Target date: 10/29/22   Patient educated on vaginal moisturizers and to moisturize the rectum.  Baseline: Goal status: Met 11/09/22   2.  Patient reports her pelvic pain decreased </= 25% due to reduction of pelvic floor trigger points.  Baseline:  Goal status: Ongoing 11/19/22   3.  Patient understands ways to manage her stress and pelvic pain.  Baseline:  Goal status: Met 11/16/22     LONG TERM GOALS: Target date: 12/24/22   Patient independent with advanced HEP for manual work on pelvic floor and ways to elongate the pelvic floor.  Baseline:  Goal status: ongoing 12/05/22   2.  Patient reports her pelvic floor pain decreased </= 75% due to reduction of trigger points.  Baseline:  Goal status: ongoing 12/05/22   3.  Patient is able to use the rectal wand to reduce her trigger points when she is in increased pain.  Baseline:  Goal status: Met 11/19/22   4.  Patient is able to fully relax her pelvic floor with a bowel movement so the stool is not stuck at the entrance.  Baseline:  Goal status: ongoing 12/05/22       PLAN:   PT FREQUENCY: 1x/week   PT DURATION: 12 weeks   PLANNED INTERVENTIONS: Therapeutic exercises, Therapeutic activity, Neuromuscular re-education, Patient/Family education, Joint mobilization, Dry Needling, Electrical stimulation, Spinal mobilization,  Cryotherapy, Moist heat, Taping, Ultrasound, Biofeedback, and Manual therapy   PLAN FOR NEXT SESSION: manual work to the pelvic floor on the left, frog stretch,  diaphragmatic breathing, Work on pushing the therapist finger out of the rectum     Eulis Foster, PT 12/05/22 8:06 AM

## 2022-12-14 ENCOUNTER — Encounter: Payer: Self-pay | Admitting: Physical Therapy

## 2022-12-14 ENCOUNTER — Ambulatory Visit: Payer: BC Managed Care – PPO | Admitting: Physical Therapy

## 2022-12-14 DIAGNOSIS — R252 Cramp and spasm: Secondary | ICD-10-CM

## 2022-12-14 DIAGNOSIS — R102 Pelvic and perineal pain: Secondary | ICD-10-CM

## 2022-12-14 NOTE — Therapy (Signed)
OUTPATIENT PHYSICAL THERAPY TREATMENT NOTE   Patient Name: Kristine James MRN: [id number] DOB:Jul 12, 1976, 47 y.o., female Today's Date: 12/14/2022  PCP: Kathreen Cosier, MD  REFERRING PROVIDER: Arby Barrette, FNP   END OF SESSION:   PT End of Session - 12/14/22 0800     Visit Number 8    Date for PT Re-Evaluation 12/24/22    Authorization Type BCBS    PT Start Time 0800    PT Stop Time 0840    PT Time Calculation (min) 40 min    Activity Tolerance Patient tolerated treatment well    Behavior During Therapy [facility] for tasks assessed/performed             History reviewed. No pertinent past medical history. Past Surgical History:  Procedure Laterality Date   rotator cuff surgery Right    There are no problems to display for this patient.  REFERRING DIAG: R10.2 (ICD-10-CM) - Pelvic and perineal pain    THERAPY DIAG:  Cramp and spasm   Pelvic pain   Rationale for Evaluation and Treatment: Rehabilitation   ONSET DATE: 2020   SUBJECTIVE:                                                                                                                                                                                            SUBJECTIVE STATEMENT: I went to the gym every day and did not have pelvic pain. No splinting this week.        PAIN:  Are you having pain? Yes NPRS scale: 0/10 today Pain location:  in inner thighs, pelvic floor   Pain type: fire, cramp, tightness Pain description: intermittent    Aggravating factors: stress, stool being stuck at the sphincter Relieving factors: sleep, swimming pool, working out, body combat, zumba, body pump, sitting in warm water   PRECAUTIONS: None   WEIGHT BEARING RESTRICTIONS: No   FALLS:  Has patient fallen in last 6 months? No   LIVING ENVIRONMENT: Lives with: lives alone   OCCUPATION: Patient works in a lab, sitting   PLOF: Independent   PATIENT GOALS: reduce pain   PERTINENT HISTORY:  IBS,  Rectocele Sexual abuse: No   BOWEL MOVEMENT: Pain with bowel movement: Yes Type of bowel movement:Type (Bristol Stool Scale) Type 4 and 5, Frequency daily, Strain No, and Splinting yes Fully empty rectum: No, residual stool in rectum Leakage: No Pads: No Fiber supplement: Yes: natural diet   URINATION: Pain with urination: No Fully empty bladder: No, when have rectal pain Stream: Strong Urgency: No Frequency: average Leakage: Laughing     PROLAPSE: Rectocele  OBJECTIVE:    COGNITION: Overall cognitive status: Within functional limits for tasks assessed                          SENSATION: Light touch: Appears intact Proprioception: Appears intact     POSTURE: No Significant postural limitations   PELVIC ALIGNMENT: correct alignment   LUMBARAROM/PROM:   A/PROM A/PROM  eval  Extension Decreased by 25%  Right lateral flexion Decreased by 25%    LOWER EXTREMITY ROM: Bilateral hip ROM is full     LOWER EXTREMITY MMT:   MMT Right eval Left eval  Hip extension 4/5 4/5  Hip abduction 4/5 4/5  Hip adduction 4/5 4/5    PALPATION:   General  Tenderness located on right lower abdominal, bilateral hip adductors                 External Perineal Exam tenderness located on the perineal body                             Internal Pelvic Floor tenderness located on bilateral levator ani and obturator internist   Patient confirms identification and approves PT to assess internal pelvic floor and treatment Yes   PELVIC MMT:   MMT eval 12/05/22 12/14/22  Internal Anal Sphincter 3/5 3/5 3/5  External Anal Sphincter 3/5 3/5 3/5  Puborectalis 3/5 3/5 3/5  (Blank rows = not tested)         TONE: increased   PROLAPSE: rectocele   TODAY'S TREATMENT:  12/14/22 Manual: Internal pelvic floor techniques: No emotional/communication barriers or cognitive limitation. Patient is motivated to learn. Patient understands and agrees with treatment goals and plan. PT  explains patient will be examined in standing, sitting, and lying down to see how their muscles and joints work. When they are ready, they will be asked to remove their underwear so PT can examine their perineum. The patient is also given the option of providing their own chaperone as one is not provided in our facility. The patient also has the right and is explained the right to defer or refuse any part of the evaluation or treatment including the internal exam. With the patient's consent, PT will use one gloved finger to gently assess the muscles of the pelvic floor, seeing how well it contracts and relaxes and if there is muscle symmetry. After, the patient will get dressed and PT and patient will discuss exam findings and plan of care. PT and patient discuss plan of care, schedule, attendance policy and HEP activities.  Going through the anus working on the iliococcygeus, puborectalis, obturator internist, along the anococcygeal ligament with one finger internally and other externally Neuromuscular re-education: Down training: While therapist finger in the anal canal working on pushing the therapist finger out with lower rib cage expansion Exercises: Stretches/mobility: Marjo Bicker pose all 3 directions holding 30 seconds Cat cow 15 x Frog leg stretch going back and forth Pigeon pose holding 30 sec bil.  Foam roll to hip adductors 30 sec each side  12/05/22 Manual: Internal pelvic floor techniques: No emotional/communication barriers or cognitive limitation. Patient is motivated to learn. Patient understands and agrees with treatment goals and plan. PT explains patient will be examined in standing, sitting, and lying down to see how their muscles and joints work. When they are ready, they will be asked to remove their underwear so PT can examine their perineum. The patient is  also given the option of providing their own chaperone as one is not provided in our facility. The patient also has the right  and is explained the right to defer or refuse any part of the evaluation or treatment including the internal exam. With the patient's consent, PT will use one gloved finger to gently assess the muscles of the pelvic floor, seeing how well it contracts and relaxes and if there is muscle symmetry. After, the patient will get dressed and PT and patient will discuss exam findings and plan of care. PT and patient discuss plan of care, schedule, attendance policy and HEP activities.  Going through the rectum working on the obturator internist, along the internal anal sphincter and external anal sphincter  Neuromuscular re-education: Pelvic floor contraction training: RUSI to the urogenital diaphragm working on pelvic floor contraction and relaxation like she is trying to push the stool out.  Exercises: Strengthening: Recumbent bike level 5 for 5 minutes.      11/30/22 Manual: Soft tissue mobilization: Manual work to the hip adductors, quadriceps, and ITB Myofascial release: Using the suction cup on the hip adductors, quadriceps, ITB to release the fascia Neuromuscular re-education: Down training: Education on ways to reduce stress during her day and during toileting to relax the pelvic floor and reduce pain Education on going to the bathroom when she has the urge for a bowel movement instead of using pain as the indicator for having a bowel movement Education on using the suction cup on the thighs to release the fascial restrictions which will relax the pelvic floor Education on using the rectal wand three times per week instead of when she has pain to keep the pelvic floor muscles relaxed.  Exercises: Stretches/mobility: Foam roll to the hip adductors with verbal cues to go slowly and work on relaxing the muscles Foam roll the quads          PATIENT EDUCATION: 11/16/22 Education details: Access Code: , information on dry needling, educated patient on how to use rectal wand internally  on the pelvic floor muscles and using the massage component.  Person educated: Patient Education method: Explanation, Demonstration, Tactile cues, Verbal cues, and Handouts Education comprehension: verbalized understanding, returned demonstration, verbal cues required, tactile cues required, and needs further education     HOME EXERCISE PROGRAM: 11/16/22 Access Code: URL: [email or web address] Date: 11/09/2022 Prepared by: Eulis Foster   Exercises - Cat Cow  - 1 x daily - 7 x weekly - 1 sets - 2 reps - Hip Hinge Rock Back  - 1 x daily - 7 x weekly - 1 sets - 2 reps - Pigeon Pose  - 1 x daily - 7 x weekly - 1 sets - 2 reps   Patient Education - Trigger Point Dry Needling   ASSESSMENT:   CLINICAL IMPRESSION: Patient is a 47 y.o. female  who was seen today for physical therapy  treatment for pelvic and perineal pain.  She is able to bulge the pelvic floor with diaphragmatic breathing. She was able to  push the therapist finger out of the rectal canal with verbal cues to breath into the lower rib cage and increase the time to breath out. Patient had one week without pain. Patient would benefit from skilled therapy to improve pain management and reduction of trigger points in the pelvic floor muscles to reduce her pain.    OBJECTIVE IMPAIRMENTS: decreased coordination, decreased strength, increased muscle spasms, and pain.    ACTIVITY LIMITATIONS: toileting   PARTICIPATION  LIMITATIONS: community activity and occupation   PERSONAL FACTORS: Time since onset of injury/illness/exacerbation and 1-2 comorbidities: IBS, Rectocele  are also affecting patient's functional outcome.    REHAB POTENTIAL: Good   CLINICAL DECISION MAKING: Stable/uncomplicated   EVALUATION COMPLEXITY: Low     GOALS: Goals reviewed with patient? Yes   SHORT TERM GOALS: Target date: 10/29/22   Patient educated on vaginal moisturizers and to moisturize the rectum.  Baseline: Goal status:  Met 11/09/22   2.  Patient reports her pelvic pain decreased </= 25% due to reduction of pelvic floor trigger points.  Baseline:  Goal status: Ongoing 11/19/22   3.  Patient understands ways to manage her stress and pelvic pain.  Baseline:  Goal status: Met 11/16/22     LONG TERM GOALS: Target date: 12/24/22   Patient independent with advanced HEP for manual work on pelvic floor and ways to elongate the pelvic floor.  Baseline:  Goal status: ongoing 12/05/22   2.  Patient reports her pelvic floor pain decreased </= 75% due to reduction of trigger points.  Baseline:  Goal status: ongoing 12/05/22   3.  Patient is able to use the rectal wand to reduce her trigger points when she is in increased pain.  Baseline:  Goal status: Met 11/19/22   4.  Patient is able to fully relax her pelvic floor with a bowel movement so the stool is not stuck at the entrance.  Baseline:  Goal status: ongoing 12/05/22       PLAN:   PT FREQUENCY: 1x/week   PT DURATION: 12 weeks   PLANNED INTERVENTIONS: Therapeutic exercises, Therapeutic activity, Neuromuscular re-education, Patient/Family education, Joint mobilization, Dry Needling, Electrical stimulation, Spinal mobilization, Cryotherapy, Moist heat, Taping, Ultrasound, Biofeedback, and Manual therapy   PLAN FOR NEXT SESSION:  diaphragmatic breathing working on the lower rib cage, decide on discharge or renewal  Eulis Foster, PT 12/14/22 8:41 AM

## 2022-12-21 ENCOUNTER — Encounter: Payer: Self-pay | Admitting: Physical Therapy

## 2022-12-21 ENCOUNTER — Ambulatory Visit: Payer: BC Managed Care – PPO | Admitting: Physical Therapy

## 2022-12-21 DIAGNOSIS — R102 Pelvic and perineal pain: Secondary | ICD-10-CM

## 2022-12-21 DIAGNOSIS — R252 Cramp and spasm: Secondary | ICD-10-CM

## 2022-12-21 NOTE — Therapy (Signed)
OUTPATIENT PHYSICAL THERAPY TREATMENT NOTE   Patient Name: Kristine James MRN: [id number] DOB:[birth year], 47 y.o., female Today's Date: 12/21/2022  PCP: Kathreen Cosier, MD  REFERRING PROVIDER: Arby Barrette, FNP   END OF SESSION:   PT End of Session - 12/21/22 0803     Visit Number 9    Date for PT Re-Evaluation 12/24/22    Authorization Type BCBS    PT Start Time 0800    PT Stop Time 0840    PT Time Calculation (min) 40 min    Activity Tolerance Patient tolerated treatment well    Behavior During Therapy [facility] for tasks assessed/performed             History reviewed. No pertinent past medical history. Past Surgical History:  Procedure Laterality Date   rotator cuff surgery Right    There are no problems to display for this patient.  REFERRING DIAG: R10.2 (ICD-10-CM) - Pelvic and perineal pain    THERAPY DIAG:  Cramp and spasm   Pelvic pain   Rationale for Evaluation and Treatment: Rehabilitation   ONSET DATE: 2020   SUBJECTIVE:                                                                                                                                                                                            SUBJECTIVE STATEMENT: I am doing well.         PAIN:  Are you having pain? Yes NPRS scale: 0/10 today Pain location:  in inner thighs, pelvic floor   Pain type: fire, cramp, tightness Pain description: intermittent    Aggravating factors: stress, stool being stuck at the sphincter Relieving factors: sleep, swimming pool, working out, body combat, zumba, body pump, sitting in warm water   PRECAUTIONS: None   WEIGHT BEARING RESTRICTIONS: No   FALLS:  Has patient fallen in last 6 months? No   LIVING ENVIRONMENT: Lives with: lives alone   OCCUPATION: Patient works in a lab, sitting   PLOF: Independent   PATIENT GOALS: reduce pain   PERTINENT HISTORY:  IBS, Rectocele Sexual abuse: No   BOWEL MOVEMENT: Pain with bowel  movement: Yes Type of bowel movement:Type (Bristol Stool Scale) Type 4 and 5, Frequency daily, Strain No, and Splinting yes Fully empty rectum: No, residual stool in rectum Leakage: No Pads: No Fiber supplement: Yes: natural diet   URINATION: Pain with urination: No Fully empty bladder: No, when have rectal pain Stream: Strong Urgency: No Frequency: average Leakage: Laughing     PROLAPSE: Rectocele       OBJECTIVE:    COGNITION: Overall cognitive status: Within functional limits  for tasks assessed                          SENSATION: Light touch: Appears intact Proprioception: Appears intact     POSTURE: No Significant postural limitations   PELVIC ALIGNMENT: correct alignment   LUMBARAROM/PROM:   A/PROM A/PROM  eval  Extension Decreased by 25%  Right lateral flexion Decreased by 25%    LOWER EXTREMITY ROM: Bilateral hip ROM is full     LOWER EXTREMITY MMT:   MMT Right eval Left eval Right/left 12/21/22  Hip extension 4/5 4/5 5/5  Hip abduction 4/5 4/5 5/5  Hip adduction 4/5 4/5 5/5    PALPATION:   General  Tenderness located on right lower abdominal, bilateral hip adductors                 External Perineal Exam no tenderness located on the perineal body                             Internal Pelvic Floor no tenderness located on bilateral levator ani and obturator internist   Patient confirms identification and approves PT to assess internal pelvic floor and treatment Yes   PELVIC MMT:   MMT eval 12/05/22 12/14/22  Internal Anal Sphincter 3/5 3/5 3/5  External Anal Sphincter 3/5 3/5 3/5  Puborectalis 3/5 3/5 3/5  (Blank rows = not tested)         TONE: increased   PROLAPSE: rectocele   TODAY'S TREATMENT:  12/21/22 Manual: Soft tissue mobilization: Manual work to bilateral hip adductors, quadriceps and ITB Myofascial release: Using suction to bilateral hip adductors and quadriceps Exercises: Stretches/mobility: Foam roll to  quadriceps Foam roll to piriformis bil.  Foam roll to the ITB bil. Foam roll to inner thigh bil.      12/14/22 Manual: Internal pelvic floor techniques: No emotional/communication barriers or cognitive limitation. Patient is motivated to learn. Patient understands and agrees with treatment goals and plan. PT explains patient will be examined in standing, sitting, and lying down to see how their muscles and joints work. When they are ready, they will be asked to remove their underwear so PT can examine their perineum. The patient is also given the option of providing their own chaperone as one is not provided in our facility. The patient also has the right and is explained the right to defer or refuse any part of the evaluation or treatment including the internal exam. With the patient's consent, PT will use one gloved finger to gently assess the muscles of the pelvic floor, seeing how well it contracts and relaxes and if there is muscle symmetry. After, the patient will get dressed and PT and patient will discuss exam findings and plan of care. PT and patient discuss plan of care, schedule, attendance policy and HEP activities.  Going through the anus working on the iliococcygeus, puborectalis, obturator internist, along the anococcygeal ligament with one finger internally and other externally Neuromuscular re-education: Down training: While therapist finger in the anal canal working on pushing the therapist finger out with lower rib cage expansion Exercises: Stretches/mobility: Marjo Bicker pose all 3 directions holding 30 seconds Cat cow 15 x Frog leg stretch going back and forth Pigeon pose holding 30 sec bil.  Foam roll to hip adductors 30 sec each side  12/05/22 Manual: Internal pelvic floor techniques: No emotional/communication barriers or cognitive limitation. Patient is motivated to learn. Patient  understands and agrees with treatment goals and plan. PT explains patient will be examined in  standing, sitting, and lying down to see how their muscles and joints work. When they are ready, they will be asked to remove their underwear so PT can examine their perineum. The patient is also given the option of providing their own chaperone as one is not provided in our facility. The patient also has the right and is explained the right to defer or refuse any part of the evaluation or treatment including the internal exam. With the patient's consent, PT will use one gloved finger to gently assess the muscles of the pelvic floor, seeing how well it contracts and relaxes and if there is muscle symmetry. After, the patient will get dressed and PT and patient will discuss exam findings and plan of care. PT and patient discuss plan of care, schedule, attendance policy and HEP activities.  Going through the rectum working on the obturator internist, along the internal anal sphincter and external anal sphincter  Neuromuscular re-education: Pelvic floor contraction training: RUSI to the urogenital diaphragm working on pelvic floor contraction and relaxation like she is trying to push the stool out.  Exercises: Strengthening: Recumbent bike level 5 for 5 minutes.      11/30/22 Manual: Soft tissue mobilization: Manual work to the hip adductors, quadriceps, and ITB Myofascial release: Using the suction cup on the hip adductors, quadriceps, ITB to release the fascia Neuromuscular re-education: Down training: Education on ways to reduce stress during her day and during toileting to relax the pelvic floor and reduce pain Education on going to the bathroom when she has the urge for a bowel movement instead of using pain as the indicator for having a bowel movement Education on using the suction cup on the thighs to release the fascial restrictions which will relax the pelvic floor Education on using the rectal wand three times per week instead of when she has pain to keep the pelvic floor muscles relaxed.   Exercises: Stretches/mobility: Foam roll to the hip adductors with verbal cues to go slowly and work on relaxing the muscles Foam roll the quads          PATIENT EDUCATION: 11/16/22 Education details: Access Code: , information on dry needling, educated patient on how to use rectal wand internally on the pelvic floor muscles and using the massage component.  Person educated: Patient Education method: Explanation, Demonstration, Tactile cues, Verbal cues, and Handouts Education comprehension: verbalized understanding, returned demonstration, verbal cues required, tactile cues required, and needs further education     HOME EXERCISE PROGRAM: 11/16/22 Access Code: URL: [email or web address] Date: 11/09/2022 Prepared by: Eulis Foster   Exercises - Cat Cow  - 1 x daily - 7 x weekly - 1 sets - 2 reps - Hip Hinge Rock Back  - 1 x daily - 7 x weekly - 1 sets - 2 reps - Pigeon Pose  - 1 x daily - 7 x weekly - 1 sets - 2 reps   Patient Education - Trigger Point Dry Needling   ASSESSMENT:   CLINICAL IMPRESSION: Patient is a 47 y.o. female  who was seen today for physical therapy  treatment for pelvic and perineal pain.  She is able to bulge the pelvic floor with diaphragmatic breathing. She was able to  push the therapist finger out of the rectal canal with verbal cues to breath into the lower rib cage and increase the time to breath out. Pelvic floor strength is  3/5. She understands when the stool is stuck then she is to relax her pelvic floor instead of using her finger to evacuate the stool. Patient understands ways to manage her pain with yoga, rectal wand, diaphragmatic breathing. Her rectal pain decreased by 75%.   OBJECTIVE IMPAIRMENTS: decreased coordination, decreased strength, increased muscle spasms, and pain.    ACTIVITY LIMITATIONS: toileting   PARTICIPATION LIMITATIONS: community activity and occupation   PERSONAL FACTORS: Time since onset of  injury/illness/exacerbation and 1-2 comorbidities: IBS, Rectocele  are also affecting patient's functional outcome.    REHAB POTENTIAL: Good   CLINICAL DECISION MAKING: Stable/uncomplicated   EVALUATION COMPLEXITY: Low     GOALS: Goals reviewed with patient? Yes   SHORT TERM GOALS: Target date: 10/29/22   Patient educated on vaginal moisturizers and to moisturize the rectum.  Baseline: Goal status: Met 11/09/22   2.  Patient reports her pelvic pain decreased </= 25% due to reduction of pelvic floor trigger points.  Baseline:  Goal status: Met 12/21/22   3.  Patient understands ways to manage her stress and pelvic pain.  Baseline:  Goal status: Met 11/16/22     LONG TERM GOALS: Target date: 12/24/22   Patient independent with advanced HEP for manual work on pelvic floor and ways to elongate the pelvic floor.  Baseline:  Goal status:Met 12/21/22   2.  Patient reports her pelvic floor pain decreased </= 75% due to reduction of trigger points.  Baseline:  Goal status: Met  12/21/22   3.  Patient is able to use the rectal wand to reduce her trigger points when she is in increased pain.  Baseline:  Goal status: Met 11/19/22   4.  Patient is able to fully relax her pelvic floor with a bowel movement so the stool is not stuck at the entrance.  Baseline:  Goal status: Partially 12/05/22       PLAN: Discharge to HEP today.      Eulis Foster, PT 12/21/22 8:45 AM   PHYSICAL THERAPY DISCHARGE SUMMARY  Visits from Start of Care: 9  Current functional level related to goals / functional outcomes: See above.    Remaining deficits: See above   Education / Equipment: HEP   Patient agrees to discharge. Patient goals were met. Patient is being discharged due to meeting the stated rehab goals. Thank you for the referral. Eulis Foster, PT 12/21/22 8:45 AM
